# Patient Record
Sex: Female | Born: 1965 | Race: White | Hispanic: No | Marital: Married | Smoking: Current every day smoker
Health system: Southern US, Community
[De-identification: ages and names within clinical notes are randomized; demographics above are authoritative.]

## PROBLEM LIST (undated history)

## (undated) DIAGNOSIS — E119 Type 2 diabetes mellitus without complications: Secondary | ICD-10-CM

## (undated) DIAGNOSIS — K219 Gastro-esophageal reflux disease without esophagitis: Secondary | ICD-10-CM

## (undated) DIAGNOSIS — F419 Anxiety disorder, unspecified: Secondary | ICD-10-CM

## (undated) DIAGNOSIS — F32A Depression, unspecified: Secondary | ICD-10-CM

## (undated) DIAGNOSIS — F329 Major depressive disorder, single episode, unspecified: Secondary | ICD-10-CM

## (undated) HISTORY — PX: EYE SURGERY: SHX253

## (undated) HISTORY — PX: ABDOMINAL HYSTERECTOMY: SHX81

## (undated) HISTORY — PX: LAPAROSCOPIC ABDOMINAL EXPLORATION: SHX6249

## (undated) HISTORY — PX: CHOLECYSTECTOMY: SHX55

---

## 2010-09-28 ENCOUNTER — Encounter: Payer: Self-pay | Admitting: Emergency Medicine

## 2010-09-28 ENCOUNTER — Ambulatory Visit
Admission: RE | Admit: 2010-09-28 | Discharge: 2010-09-28 | Payer: Self-pay | Source: Home / Self Care | Admitting: Emergency Medicine

## 2010-09-28 DIAGNOSIS — K219 Gastro-esophageal reflux disease without esophagitis: Secondary | ICD-10-CM | POA: Insufficient documentation

## 2010-09-28 DIAGNOSIS — M654 Radial styloid tenosynovitis [de Quervain]: Secondary | ICD-10-CM | POA: Insufficient documentation

## 2010-09-28 DIAGNOSIS — J309 Allergic rhinitis, unspecified: Secondary | ICD-10-CM | POA: Insufficient documentation

## 2010-09-28 DIAGNOSIS — M25539 Pain in unspecified wrist: Secondary | ICD-10-CM | POA: Insufficient documentation

## 2010-10-04 ENCOUNTER — Telehealth (INDEPENDENT_AMBULATORY_CARE_PROVIDER_SITE_OTHER): Payer: Self-pay | Admitting: *Deleted

## 2010-10-14 NOTE — Progress Notes (Signed)
  Phone Note Outgoing Call Call back at [facility] Phone [phone]   Call placed by: Lajean Saver RN,  October 04, 2010 2:02 PM Call placed to: Patient Summary of Call: Callback: No answer, message left with reason for call and call back with any questions or concerns

## 2010-10-14 NOTE — Letter (Signed)
Summary: Internal Correspondence  Internal Correspondence   Imported By: Dannette Barbara 09/28/2010 14:19:30  _____________________________________________________________________  External Attachment:    Type:   Image     Comment:   External Document

## 2010-10-14 NOTE — Assessment & Plan Note (Signed)
Summary: POSSIBLE FRATURE OF RIGHT WRIST? NH   Vital Signs:  Patient Profile:   45 Years Old Female CC:      Rt wrist pain x 1 day hx of fracture Height:     64 inches Weight:      142 pounds O2 Sat:      98 % O2 treatment:    Room Air Temp:     97.8 degrees F oral Pulse rate:   78 / minute Pulse rhythm:   regular Resp:     16 per minute BP sitting:   109 / 69  (left arm) Cuff size:   regular  Vitals Entered By: Emilio Math (September 28, 2010 9:21 AM)                  Current Allergies: ! VICODIN ! CODEINE ! SEPTRA ! NAPROSYNHistory of Present Illness History from: patient Chief Complaint: Rt wrist pain x 1 day hx of fracture History of Present Illness: Patient with wrist pain since this morning.  Woke up with pain.  No known trauma or falling.  The only thing she can think of is lifting and carrying a chair yesterday and has been lifting her dogs. Pain is constant and sharp and radiates into hand and she thinks it "feels like a fracture" and would like to have it checked.  Last year she had discomfort in that hand/thumb and was worked up by neurology for carpal tunnel syndrome but all tests were normal.  She was thought to have Raynauds.  She did have a possible distal radius fracture about 10 years ago.  She has a soft brace that helps. She is planning on donating a kidney in 1-2 months.  Current Meds DEXILANT 30 MG CPDR (DEXLANSOPRAZOLE)  CELEXA 20 MG TABS (CITALOPRAM HYDROBROMIDE)  VIVELLE-DOT 0.0375 MG/24HR PTTW (ESTRADIOL)  FLONASE 50 MCG/ACT SUSP (FLUTICASONE PROPIONATE)  ZYRTEC ALLERGY 10 MG TABS (CETIRIZINE HCL)  PREDNISONE 10 MG TABS (PREDNISONE) 20mg  two times a day for 3 days, no taper  REVIEW OF SYSTEMS Constitutional Symptoms      Denies fever, chills, night sweats, weight loss, weight gain, and fatigue.  Eyes       Denies change in vision, eye pain, eye discharge, glasses, contact lenses, and eye surgery. Ear/Nose/Throat/Mouth       Denies hearing  loss/aids, change in hearing, ear pain, ear discharge, dizziness, frequent runny nose, frequent nose bleeds, sinus problems, sore throat, hoarseness, and tooth pain or bleeding.  Respiratory       Denies dry cough, productive cough, wheezing, shortness of breath, asthma, bronchitis, and emphysema/COPD.  Cardiovascular       Denies murmurs, chest pain, and tires easily with exhertion.    Gastrointestinal       Denies stomach pain, nausea/vomiting, diarrhea, constipation, blood in bowel movements, and indigestion. Genitourniary       Denies painful urination, kidney stones, and loss of urinary control. Neurological       Denies paralysis, seizures, and fainting/blackouts. Musculoskeletal       Complains of muscle pain, joint pain, joint stiffness, and decreased range of motion.      Denies redness, swelling, muscle weakness, and gout.  Skin       Denies bruising, unusual mles/lumps or sores, and hair/skin or nail changes.  Psych       Denies mood changes, temper/anger issues, anxiety/stress, speech problems, depression, and sleep problems.  Past History:  Past Medical History: Allergic rhinitis GERD  Past Surgical History: Cholecystectomy  Hysterectomy Eye surgery  Family History: Mother, D, Heart Disease Father, D, Heart Disease  Social History: 1/2 ppd, 23 yrs ETOH-no No [facility] Physical Exam General appearance: well developed, well nourished, no acute distress Head: normocephalic, atraumatic Neurological: distal NV status intact MSE: oriented to time, place, and person R hand/wrist: FROM, full strength all directions.  +TTP radial aspect APL/EPB, mild snuffbox tenderness but mostly on the tendons.  +Finklestein's normal.  Exam normal for ulnar, radial, and median nerve distributions, normal grip. No other bony tendneress Assessment New Problems: DE QUERVAIN'S TENOSYNOVITIS (ICD-727.04) GERD (ICD-530.81) ALLERGIC RHINITIS (ICD-477.9) WRIST PAIN, RIGHT  (ICD-719.43)   Plan New Medications/Changes: PREDNISONE 10 MG TABS (PREDNISONE) 20mg  two times a day for 3 days, no taper  #QS x 0, 09/28/2010, Hoyt Koch MD  New Orders: New Patient Level III [phone] T-DG Wrist Complete*R* [73110] Thumb Spica [L3070] Planning Comments:   DeQuervain's tenosynovitis from recent lifting.  Xray is normal.  Encouraged ice a few times a day (but caution with possible Raynaud's syndrome).  A thumb spica wrist splint was applied. Referral to Dr. Pearletha Forge if not better in a week to consider injection.  Patient prefers to wait on injection until then.  Instead Rx for 3 days Prednisone given and then may use Ibuprofen for a few more days after that and/or Tylenol.  She should contact the transplant center to let them know her current medications.   The patient and/or caregiver has been counseled thoroughly with regard to medications prescribed including dosage, schedule, interactions, rationale for use, and possible side effects and they verbalize understanding.  Diagnoses and expected course of recovery discussed and will return if not improved as expected or if the condition worsens. Patient and/or caregiver verbalized understanding.  Prescriptions: PREDNISONE 10 MG TABS (PREDNISONE) 20mg  two times a day for 3 days, no taper  #QS x 0   Entered and Authorized by:   Hoyt Koch MD   Signed by:   Hoyt Koch MD on 09/28/2010   Method used:   Print then Give to Patient   RxID:   [id number]   Orders Added: 1)  New Patient Level III [08657] 2)  T-DG Wrist Complete*R* [73110] 3)  Thumb Spica [L3070]

## 2011-09-21 ENCOUNTER — Emergency Department (INDEPENDENT_AMBULATORY_CARE_PROVIDER_SITE_OTHER)
Admission: EM | Admit: 2011-09-21 | Discharge: 2011-09-21 | Disposition: A | Discharge status: Home / Self Care | Payer: PRIVATE HEALTH INSURANCE | Source: Home / Self Care | Attending: Family Medicine | Admitting: Family Medicine

## 2011-09-21 ENCOUNTER — Encounter: Payer: Self-pay | Admitting: *Deleted

## 2011-09-21 DIAGNOSIS — J0100 Acute maxillary sinusitis, unspecified: Secondary | ICD-10-CM

## 2011-09-21 MED ORDER — AMOXICILLIN 875 MG PO TABS: 875.0000 mg | ORAL_TABLET | Freq: Two times a day (BID) | ORAL | Status: AC

## 2011-09-21 NOTE — ED Provider Notes (Signed)
History     CSN: [id number]  Arrival date & time 09/21/11  9147   First MD Initiated Contact with Patient 09/21/11 1216      Chief Complaint  Patient presents with  . Sinusitis      HPI Comments: Patient complains of onset of a mild cold about 3 weeks ago that gradually improved.  Over the past 6 days she has developed increased right facial pressure and nasal congestion.  Her right eye began draining yesterday (primarily in morning) and she has had low grade fever for about a week. She uses Nasonex spray occasionally.  Patient is a 46 y.o. female presenting with sinusitis. The history is provided by the patient.  Sinusitis  This is a new problem. The current episode started more than 1 week ago. The problem has been gradually worsening. Maximum temperature: low grade. The pain has been intermittent since onset. Associated symptoms include chills, congestion, sinus pressure, sore throat and cough. Pertinent negatives include no sweats, no ear pain, no hoarse voice, no swollen glands and no shortness of breath. Treatments tried: Zyrtec. The treatment provided mild relief.    Past Medical History  Diagnosis Date  . Endometriosis     Past Surgical History  Procedure Date  . Abdominal hysterectomy     Family History  Problem Relation Age of Onset  . Cancer Mother     breast    History  Substance Use Topics  . Smoking status: Current Everyday Smoker  . Smokeless tobacco: Not on file  . Alcohol Use: No    OB History    Grav Para Term Preterm Abortions TAB SAB Ect Mult Living                  Review of Systems  Constitutional: Positive for chills.  HENT: Positive for congestion, sore throat and sinus pressure. Negative for ear pain and hoarse voice.   Respiratory: Positive for cough. Negative for shortness of breath.    No sore throat presently. + cough No pleuritic pain No wheezing + nasal congestion + post-nasal drainage + sinus pain/pressure + itchy/red eye  on right No earache No hemoptysis No SOB + low grade fever/chills No nausea No vomiting No abdominal pain No diarrhea No urinary symptoms No skin rashes No fatigue No myalgias No headache Used OTC meds without relief  Allergies  Codeine; Hydrocodone-acetaminophen; Naproxen; and Sulfamethoxazole w/trimethoprim  Home Medications   Current Outpatient Rx  Name Route Sig Dispense Refill  . CITALOPRAM HYDROBROMIDE 20 MG PO TABS Oral Take 20 mg by mouth daily.    . DEXLANSOPRAZOLE 60 MG PO CPDR Oral Take 60 mg by mouth daily.    Marland Kitchen ESTRADIOL 0.075 MG/24HR TD PTTW Transdermal Place 1 patch onto the skin 2 (two) times a week.    Marland Kitchen NAPROXEN 125 MG/5ML PO SUSP Oral Take by mouth 2 (two) times daily.    . AMOXICILLIN 875 MG PO TABS Oral Take 1 tablet (875 mg total) by mouth 2 (two) times daily. 28 tablet 0    BP 99/65  Pulse 79  Temp(Src) 98.4 F (36.9 C) (Oral)  Resp 14  Ht 5\' 4"  (1.626 m)  Wt 135 lb (61.236 kg)  BMI 23.17 kg/m2  SpO2 99%  Physical Exam Nursing notes and Vital Signs reviewed. Appearance:  Patient appears healthy, stated age, and in no acute distress Eyes:  Pupils are equal, round, and reactive to light and accomodation.  Extraocular movement is intact.  Conjunctivae are not inflamed  Ears:  Canals normal.  Tympanic membranes normal.  Nose:  Moderately congested turbinates, worse on the right.  Right maxillary sinus tenderness is present.  Pharynx:  Normal Neck:  Supple.    Lungs:  Clear to auscultation.  Breath sounds are equal.  Heart:  Regular rate and rhythm without murmurs, rubs, or gallops.  Skin:  No rash present.   ED Course  Procedures  none      1. Acute maxillary sinusitis       MDM  Begin amoxicillin for two weeks. Take Mucinex D (guaifenesin with decongestant) twice daily for congestion.  Increase fluid intake, rest. May use Afrin nasal spray (or generic oxymetazoline) twice daily for about 5 days.  Also recommend using saline nasal  spray several times daily and/or saline nasal irrigation.  Use Nasonex spray each day after using Afrin and Saline irrigation.  Stop all antihistamines for now, and other non-prescription cough/cold preparations. Follow-up with family doctor if not improving 7 to 10 days        Donna Christen, MD 09/21/11 1237

## 2011-09-21 NOTE — ED Notes (Signed)
Patient c/o having a "cold since Christmas". In the past week she developed chills, facial pain, sinus drainage, cough and right eye drainage.

## 2011-09-21 NOTE — Discharge Instructions (Signed)
Take Mucinex D (guaifenesin with decongestant) twice daily for congestion.  Increase fluid intake, rest. May use Afrin nasal spray (or generic oxymetazoline) twice daily for about 5 days.  Also recommend using saline nasal spray several times daily and/or saline nasal irrigation.  Use Nasonex spray each day after using Afrin and Saline irrigation.  Stop all antihistamines for now, and other non-prescription cough/cold preparations. Follow-up with family doctor if not improving 7 to 10 days.

## 2011-09-23 ENCOUNTER — Telehealth: Payer: Self-pay | Admitting: Family Medicine

## 2012-03-28 ENCOUNTER — Emergency Department (INDEPENDENT_AMBULATORY_CARE_PROVIDER_SITE_OTHER): Payer: PRIVATE HEALTH INSURANCE

## 2012-03-28 ENCOUNTER — Emergency Department (INDEPENDENT_AMBULATORY_CARE_PROVIDER_SITE_OTHER)
Admission: EM | Admit: 2012-03-28 | Discharge: 2012-03-28 | Disposition: A | Discharge status: Home / Self Care | Payer: PRIVATE HEALTH INSURANCE | Source: Home / Self Care | Attending: Family Medicine | Admitting: Family Medicine

## 2012-03-28 ENCOUNTER — Encounter: Payer: Self-pay | Admitting: *Deleted

## 2012-03-28 DIAGNOSIS — M545 Low back pain, unspecified: Secondary | ICD-10-CM

## 2012-03-28 DIAGNOSIS — M533 Sacrococcygeal disorders, not elsewhere classified: Secondary | ICD-10-CM

## 2012-03-28 DIAGNOSIS — M549 Dorsalgia, unspecified: Secondary | ICD-10-CM

## 2012-03-28 MED ORDER — CYCLOBENZAPRINE HCL 10 MG PO TABS: 10.0000 mg | ORAL_TABLET | Freq: Three times a day (TID) | ORAL | Status: AC | PRN

## 2012-03-28 MED ORDER — PREDNISONE 20 MG PO TABS: 20.0000 mg | ORAL_TABLET | Freq: Two times a day (BID) | ORAL | Status: AC
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
## 2012-07-09 ENCOUNTER — Encounter: Payer: Self-pay | Admitting: *Deleted

## 2012-07-09 ENCOUNTER — Emergency Department (INDEPENDENT_AMBULATORY_CARE_PROVIDER_SITE_OTHER): Payer: PRIVATE HEALTH INSURANCE

## 2012-07-09 ENCOUNTER — Emergency Department (INDEPENDENT_AMBULATORY_CARE_PROVIDER_SITE_OTHER)
Admission: EM | Admit: 2012-07-09 | Discharge: 2012-07-09 | Disposition: A | Discharge status: Home / Self Care | Payer: PRIVATE HEALTH INSURANCE | Source: Home / Self Care

## 2012-07-09 DIAGNOSIS — J069 Acute upper respiratory infection, unspecified: Secondary | ICD-10-CM

## 2012-07-09 DIAGNOSIS — Z716 Tobacco abuse counseling: Secondary | ICD-10-CM

## 2012-07-09 DIAGNOSIS — J40 Bronchitis, not specified as acute or chronic: Secondary | ICD-10-CM

## 2012-07-09 MED ORDER — AZITHROMYCIN 250 MG PO TABS: ORAL_TABLET | ORAL | Status: DC

## 2012-07-09 MED ORDER — METHYLPREDNISOLONE SODIUM SUCC 125 MG IJ SOLR
125.0000 mg | Freq: Once | INTRAMUSCULAR | Status: AC
  Administered 2012-07-09: 125 mg via INTRAMUSCULAR

## 2012-07-09 MED ORDER — PREDNISONE 50 MG PO TABS: ORAL_TABLET | ORAL | Status: DC

## 2012-07-09 NOTE — ED Notes (Signed)
Patient c/o croupe cough not productive, sinus problems, and fatigue 2 weeks on and off.

## 2012-07-09 NOTE — ED Provider Notes (Signed)
History     CSN: [id number]  Arrival date & time 07/09/12  1535   First MD Initiated Contact with Patient 07/09/12 1541      Chief Complaint  Patient presents with  . Sore Throat  . Sinus Problem   HPI URI Symptoms Onset: 2 weeks  Description: sinus pressure, nasal congestion, cough, sore throat, nasal drainage  Modifying factors:  + smoker   Symptoms Nasal discharge: yes  Fever: no Sore throat: yes Cough: yes Wheezing: no Ear pain: no GI symptoms: no Sick contacts: yes; multiple   Red Flags  Stiff neck: no Dyspnea: mild Rash: no Swallowing difficulty: no  Sinusitis Risk Factors Headache/face pain: no Double sickening: no tooth pain: no  Allergy Risk Factors Sneezing: yes Itchy scratchy throat: no Seasonal symptoms: yes  Flu Risk Factors Headache: no muscle aches: no severe fatigue: no   Past Medical History  Diagnosis Date  . Endometriosis     Past Surgical History  Procedure Date  . Abdominal hysterectomy   . Eye surgery     Family History  Problem Relation Age of Onset  . Cancer Mother     breast  . Heart Problems Other     History  Substance Use Topics  . Smoking status: Current Every Day Smoker  . Smokeless tobacco: Not on file  . Alcohol Use: No    OB History    Grav Para Term Preterm Abortions TAB SAB Ect Mult Living                  Review of Systems  All other systems reviewed and are negative.    Allergies  Codeine; Hydrocodone-acetaminophen; Naproxen; and Sulfamethoxazole w-trimethoprim  Home Medications   Current Outpatient Rx  Name Route Sig Dispense Refill  . AZITHROMYCIN 250 MG PO TABS  Take 2 tabs PO x 1 dose, then 1 tab PO QD x 4 days 6 tablet 0  . CITALOPRAM HYDROBROMIDE 20 MG PO TABS Oral Take 20 mg by mouth daily.    . DEXLANSOPRAZOLE 60 MG PO CPDR Oral Take 60 mg by mouth daily.    Marland Kitchen ESTRADIOL 0.075 MG/24HR TD PTTW Transdermal Place 1 patch onto the skin 2 (two) times a week.    Marland Kitchen NAPROXEN 125  MG/5ML PO SUSP Oral Take by mouth 2 (two) times daily.    Marland Kitchen PREDNISONE 50 MG PO TABS  1 tab daily x 5 days 5 tablet 0    BP 93/62  Pulse 74  Temp 97.6 F (36.4 C) (Oral)  Resp 20  Ht 5\' 4"  (1.626 m)  Wt 140 lb 12 oz (63.844 kg)  BMI 24.16 kg/m2  SpO2 99%  Physical Exam  Constitutional: She is oriented to person, place, and time. She appears well-developed and well-nourished.  HENT:  Head: Normocephalic and atraumatic.  Right Ear: External ear normal.  Left Ear: External ear normal.       +nasal erythema, rhinorrhea bilaterally, + post oropharyngeal erythema    Eyes: Conjunctivae normal are normal. Pupils are equal, round, and reactive to light.  Neck: Normal range of motion. Neck supple.  Cardiovascular: Normal rate, regular rhythm and normal heart sounds.   Pulmonary/Chest: Effort normal and breath sounds normal.       Faint wheezes    Abdominal: Soft. Bowel sounds are normal.  Musculoskeletal: Normal range of motion.  Lymphadenopathy:    She has no cervical adenopathy.  Neurological: She is alert and oriented to person, place, and time.  Skin: Skin  is warm.    ED Course  Procedures (including critical care time)  Labs Reviewed - No data to display Dg Chest 2 View  07/09/2012  *RADIOLOGY REPORT*  Clinical Data: Cough  CHEST - 2 VIEW  Comparison: 03/28/2012  Findings: Heart size is normal.  Mediastinal shadows are normal. There is bronchial thickening consistent with bronchitis but no consolidation, collapse or effusion.  No significant bony finding.  IMPRESSION: Bronchitis.  No consolidation or collapse.   Original Report Authenticated By: Thomasenia Sales, M.D.      1. URI (upper respiratory infection)   2. Bronchitis   3. Tobacco abuse counseling       MDM  Bronchitis noted on CXR  Likely viral induced with underlying smoking history.  Soulmedrol 125mg  IM x1 Prednisone x 5 days.  Zpak for atypical coverage.  Discussed smoking cessation.  Infectious and  resp red flags reviewed.  Follow up as needed.     The patient and/or caregiver has been counseled thoroughly with regard to treatment plan and/or medications prescribed including dosage, schedule, interactions, rationale for use, and possible side effects and they verbalize understanding. Diagnoses and expected course of recovery discussed and will return if not improved as expected or if the condition worsens. Patient and/or caregiver verbalized understanding.              Doree Albee, MD 07/09/12 1710

## 2012-07-11 ENCOUNTER — Telehealth: Payer: Self-pay | Admitting: *Deleted

## 2012-07-11 NOTE — ED Notes (Signed)
Ashley Lynn called reporting facial redness and swelling of eyelids that started Monday night (07/09/12). She had started the zpak and received an injection of Solumedrol before the reaction but had not started the prednisone. She has taken zpak in the past without reaction and is unsure about solumedrol. She denies any respiratory or cardiac distress. After talking with Dr. Cathren Harsh, I advised her to continue the prednisone, stop the zpak and take an antihistamine. Come in for a follow up if no improvement in 24 hours. Charrise asked about another antibiotic, I told her one was not needed since per Dr. Elwood Blas note she is likely viral and zpak was given for atypical coverage. If she worsens then antibiotic will be revisited. She verbalized understanding will take Zyrtec.

## 2013-10-09 ENCOUNTER — Emergency Department (INDEPENDENT_AMBULATORY_CARE_PROVIDER_SITE_OTHER)
Admission: EM | Admit: 2013-10-09 | Discharge: 2013-10-09 | Disposition: A | Discharge status: Home / Self Care | Payer: PRIVATE HEALTH INSURANCE | Source: Home / Self Care | Attending: Family Medicine | Admitting: Family Medicine

## 2013-10-09 ENCOUNTER — Emergency Department (INDEPENDENT_AMBULATORY_CARE_PROVIDER_SITE_OTHER): Payer: PRIVATE HEALTH INSURANCE

## 2013-10-09 ENCOUNTER — Encounter: Payer: Self-pay | Admitting: Emergency Medicine

## 2013-10-09 DIAGNOSIS — R059 Cough, unspecified: Secondary | ICD-10-CM

## 2013-10-09 DIAGNOSIS — R52 Pain, unspecified: Secondary | ICD-10-CM

## 2013-10-09 DIAGNOSIS — R05 Cough: Secondary | ICD-10-CM

## 2013-10-09 DIAGNOSIS — R509 Fever, unspecified: Secondary | ICD-10-CM

## 2013-10-09 DIAGNOSIS — J209 Acute bronchitis, unspecified: Secondary | ICD-10-CM

## 2013-10-09 MED ORDER — BENZONATATE 200 MG PO CAPS: 200.0000 mg | ORAL_CAPSULE | Freq: Every day | ORAL | Status: DC

## 2013-10-09 MED ORDER — CEFDINIR 300 MG PO CAPS: 300.0000 mg | ORAL_CAPSULE | Freq: Two times a day (BID) | ORAL | Status: DC

## 2013-10-09 NOTE — ED Notes (Addendum)
Ashley Lynn c/o "cold symptoms" x 1 wkek @ days ago she developed HA, body aches, fever and cough, productive at times. No flu vac this season.

## 2013-10-09 NOTE — ED Provider Notes (Signed)
CSN: [id number]     Arrival date & time 10/09/13  0907 History   First MD Initiated Contact with Patient 10/09/13 [phone]     Chief Complaint  Patient presents with  . Generalized Body Aches  . Cough     HPI Comments: Patient developed mild cold-like illness about two weeks ago, but did not feel ill at that time.  Her sinus congestion and cough have persisted.  Four days ago she developed increased fatigue, chills, low grade fever, headache, and myalgias.  Her cough has worsened.  She has tightness in her anterior chest.  The history is provided by the patient.    Past Medical History  Diagnosis Date  . Endometriosis    Past Surgical History  Procedure Laterality Date  . Abdominal hysterectomy    . Eye surgery     Family History  Problem Relation Age of Onset  . Cancer Mother     breast  . Heart Problems Other   . Heart attack Father    History  Substance Use Topics  . Smoking status: Current Every Day Smoker  . Smokeless tobacco: Never Used  . Alcohol Use: No   OB History   Grav Para Term Preterm Abortions TAB SAB Ect Mult Living                 Review of Systems + sore throat + cough No pleuritic pain, but has tightness in anterior chest No wheezing + nasal congestion + post-nasal drainage No sinus pain/pressure No itchy/red eyes No earache No hemoptysis + SOB + low grade fever, + chills + nausea + vomiting, resolved No abdominal pain No diarrhea No urinary symptoms No skin rash + fatigue + myalgias + headache Used OTC meds without relief  Allergies  Codeine; Hydrocodone-acetaminophen; Naproxen; and Sulfamethoxazole-trimethoprim  Home Medications   Current Outpatient Rx  Name  Route  Sig  Dispense  Refill  . benzonatate (TESSALON) 200 MG capsule   Oral   Take 1 capsule (200 mg total) by mouth at bedtime. Take as needed for cough   12 capsule   0   . cefdinir (OMNICEF) 300 MG capsule   Oral   Take 1 capsule (300 mg total) by mouth 2 (two)  times daily.   20 capsule   0   . citalopram (CELEXA) 20 MG tablet   Oral   Take 20 mg by mouth daily.         Marland Kitchen dexlansoprazole (DEXILANT) 60 MG capsule   Oral   Take 60 mg by mouth daily.         Marland Kitchen estradiol (VIVELLE-DOT) 0.075 MG/24HR   Transdermal   Place 1 patch onto the skin 2 (two) times a week.          BP 102/65  Pulse 84  Temp(Src) 98.2 F (36.8 C) (Oral)  Resp 16  Wt 142 lb (64.411 kg)  SpO2 99% Physical Exam Nursing notes and Vital Signs reviewed. Appearance:  Patient appears healthy, stated age, and in no acute distress Eyes:  Pupils are equal, round, and reactive to light and accomodation.  Extraocular movement is intact.  Conjunctivae are not inflamed  Ears:  Canals normal.  Tympanic membranes normal.  Nose:  Mildly congested turbinates.  No sinus tenderness.   Pharynx:  Normal Neck:  Supple.  Tender shotty posterior nodes are palpated bilaterally  Lungs:  Clear to auscultation.  Breath sounds are equal.  Heart:  Regular rate and rhythm without murmurs, rubs, or  gallops.  Abdomen:  Nontender without masses or hepatosplenomegaly.  Bowel sounds are present.  No CVA or flank tenderness.  Extremities:  No edema.  No calf tenderness Skin:  No rash present.   ED Course  Procedures  none    Imaging Review Dg Chest 2 View  10/09/2013   CLINICAL DATA:  Cough.  Fever.  Body aches.  EXAM: CHEST  2 VIEW  COMPARISON:  07/09/2012  FINDINGS: Heart size is normal. Mediastinal shadows are normal. There may be mild central bronchial thickening but there is no infiltrate, collapse or effusion. No significant bony finding.  IMPRESSION: Possible mild bronchitis.  No consolidation or collapse.   Electronically Signed   By: Paulina FusiMark  Shogry M.D.   On: 10/09/2013 10:28      MDM   1. Acute bronchitis; present illness may represent an initial viral URI with a developing bacterial bronchitis, or onset of influenza about 3 to 4 days ago.    Will begin Omnicef for bacterial  coverage.  Prescription written for Benzonatate [facility](Tessalon) to take at bedtime for night-time cough.  Take plain Mucinex (1200 mg guaifenesin) twice daily for cough and congestion.  May add Sudafed for sinus congestion.   Increase fluid intake, rest. May use Afrin nasal spray (or generic oxymetazoline) twice daily for about 5 days.  Also recommend using saline nasal spray several times daily and saline nasal irrigation (AYR is a common brand) Try warm salt water gargles for sore throat.  Stop all antihistamines for now, and other non-prescription cough/cold preparations. May take Tylenol for fever, body aches, etc. Follow-up with family doctor if not improving about one week.     Lattie HawStephen A Beese, MD 10/09/13 1048

## 2013-10-09 NOTE — Discharge Instructions (Signed)
Take plain Mucinex (1200 mg guaifenesin) twice daily for cough and congestion.  May add Sudafed for sinus congestion.   Increase fluid intake, rest. May use Afrin nasal spray (or generic oxymetazoline) twice daily for about 5 days.  Also recommend using saline nasal spray several times daily and saline nasal irrigation (AYR is a common brand) Try warm salt water gargles for sore throat.  Stop all antihistamines for now, and other non-prescription cough/cold preparations. May take Tylenol for fever, body aches, etc. Follow-up with family doctor if not improving about one week.

## 2014-06-02 ENCOUNTER — Emergency Department (INDEPENDENT_AMBULATORY_CARE_PROVIDER_SITE_OTHER)
Admission: EM | Admit: 2014-06-02 | Discharge: 2014-06-02 | Disposition: A | Discharge status: Home / Self Care | Payer: PRIVATE HEALTH INSURANCE | Source: Home / Self Care | Attending: Emergency Medicine | Admitting: Emergency Medicine

## 2014-06-02 ENCOUNTER — Encounter: Payer: Self-pay | Admitting: Emergency Medicine

## 2014-06-02 DIAGNOSIS — J0100 Acute maxillary sinusitis, unspecified: Secondary | ICD-10-CM

## 2014-06-02 DIAGNOSIS — J0101 Acute recurrent maxillary sinusitis: Secondary | ICD-10-CM

## 2014-06-02 HISTORY — DX: Major depressive disorder, single episode, unspecified: F32.9

## 2014-06-02 HISTORY — DX: Depression, unspecified: F32.A

## 2014-06-02 HISTORY — DX: Gastro-esophageal reflux disease without esophagitis: K21.9

## 2014-06-02 HISTORY — DX: Anxiety disorder, unspecified: F41.9

## 2014-06-02 MED ORDER — FLUTICASONE PROPIONATE 50 MCG/ACT NA SUSP: NASAL | Status: DC

## 2014-06-02 MED ORDER — BENZONATATE 200 MG PO CAPS: ORAL_CAPSULE | ORAL | Status: DC

## 2014-06-02 MED ORDER — AZITHROMYCIN 250 MG PO TABS: ORAL_TABLET | ORAL | Status: DC

## 2014-06-02 NOTE — ED Notes (Signed)
Reports 2 day history of facial/sinus pain with edema under right eye; cough; right ear pain; headache; hoarseness. Has recently had pancreatitis.

## 2014-06-02 NOTE — ED Provider Notes (Signed)
CSN: [id number]     Arrival date & time 06/02/14  1359 History   First MD Initiated Contact with Patient 06/02/14 1408     Chief Complaint  Patient presents with  . Facial Pain  . Otalgia  . Headache  . Cough  . Hoarse   (Consider location/radiation/quality/duration/timing/severity/associated sxs/prior Treatment) HPI SINUSITIS  Onset: 3 days Facial/sinus pressure, R>L with discolored nasal mucus.    Severity: moderate Tried OTC meds without significant relief.  Symptoms:  + Fever  + URI prodrome with nasal congestion + Minimal swollen neck glands + mild Sinus Headache + mild ear pressure, with right ear pain. No drainage  No Allergy symptoms No significant Sore Throat No eye symptoms     No significant Cough No chest pain No shortness of breath  No wheezing  No Abdominal Pain No Nausea No Vomiting No diarrhea  No Myalgias No focal neurologic symptoms No syncope No Rash  No Urinary symptoms   Past Medical History  Diagnosis Date  . Endometriosis   . Anxiety   . GERD (gastroesophageal reflux disease)   . Depression    Past Surgical History  Procedure Laterality Date  . Abdominal hysterectomy    . Eye surgery     Family History  Problem Relation Age of Onset  . Cancer Mother     breast  . Heart Problems Other   . Heart attack Father    History  Substance Use Topics  . Smoking status: Current Every Day Smoker  . Smokeless tobacco: Never Used  . Alcohol Use: No   OB History   Grav Para Term Preterm Abortions TAB SAB Ect Mult Living                 Review of Systems  All other systems reviewed and are negative.   Allergies  Codeine; Hydrocodone-acetaminophen; Naproxen; and Sulfamethoxazole-trimethoprim  Home Medications   Prior to Admission medications   Medication Sig Start Date End Date Taking? Authorizing Provider  azithromycin (ZITHROMAX Z-PAK) 250 MG tablet Take 2 tablets on day one, then 1 tablet daily on days 2 through 5  06/02/14   Lajean Manes, MD  benzonatate (TESSALON) 200 MG capsule Take 1 every 8 hours as needed for cough. 06/02/14   Lajean Manes, MD  citalopram (CELEXA) 20 MG tablet Take 20 mg by mouth daily.    Historical Provider, MD  dexlansoprazole (DEXILANT) 60 MG capsule Take 60 mg by mouth daily.    Historical Provider, MD  estradiol (VIVELLE-DOT) 0.075 MG/24HR Place 1 patch onto the skin 2 (two) times a week.    Historical Provider, MD  fluticasone Aleda Grana) 50 MCG/ACT nasal spray 1 or 2 sprays each nostril twice a day 06/02/14   Lajean Manes, MD   BP 89/58  Pulse 100  Temp(Src) 98 F (36.7 C) (Oral)  Resp 16  Ht  (1.626 m)  Wt 140 lb (63.504 kg)  BMI 24.02 kg/m2  SpO2 99% Physical Exam  Nursing note and vitals reviewed. Constitutional: She is oriented to person, place, and time. She appears well-developed and well-nourished. No distress.  HENT:  Head: Normocephalic and atraumatic.  Right Ear: External ear and ear canal normal. No drainage. Tympanic membrane is erythematous. Tympanic membrane is not bulging. A middle ear effusion is present.  Left Ear: Tympanic membrane, external ear and ear canal normal.  Nose: Mucosal edema and rhinorrhea present. Right sinus exhibits maxillary sinus tenderness. Left sinus exhibits maxillary sinus tenderness.  Mouth/Throat: Oropharynx is clear and  moist. No oral lesions. No oropharyngeal exudate.  Eyes: Conjunctivae and EOM are normal. Pupils are equal, round, and reactive to light. Right eye exhibits no discharge. Left eye exhibits no discharge. No scleral icterus.  Neck: Neck supple.  Cardiovascular: Normal rate, regular rhythm and normal heart sounds.   Pulmonary/Chest: Effort normal and breath sounds normal. She has no wheezes. She has no rales.  Lymphadenopathy:    She has no cervical adenopathy.  Neurological: She is alert and oriented to person, place, and time.  Skin: Skin is warm and dry.    ED Course  Procedures (including critical care  time) Labs Review Labs Reviewed - No data to display  Imaging Review No results found.   MDM   1. Acute recurrent maxillary sinusitis    and right otitis media  Treatment options discussed, as well as risks, benefits, alternatives. Reviewed her drug allergy history. She is allergic to penicillins and Septra. She has taken azithromycin in the past without any problems or side effects. Patient voiced understanding and agreement with the following plans: Z-Pak Flonase Other symptomatic care discussed Tessalon Perles when necessary cough  Follow-up with your primary care doctor in 5-7 days if not improving, or sooner if symptoms become worse. Precautions discussed. Red flags discussed. Questions invited and answered. Patient voiced understanding and agreement.      Lajean Manes, MD 06/02/14 [phone]

## 2014-10-24 ENCOUNTER — Emergency Department (INDEPENDENT_AMBULATORY_CARE_PROVIDER_SITE_OTHER)
Admission: EM | Admit: 2014-10-24 | Discharge: 2014-10-24 | Disposition: A | Discharge status: Home / Self Care | Payer: PRIVATE HEALTH INSURANCE | Source: Home / Self Care | Attending: Emergency Medicine | Admitting: Emergency Medicine

## 2014-10-24 ENCOUNTER — Encounter: Payer: Self-pay | Admitting: *Deleted

## 2014-10-24 DIAGNOSIS — J209 Acute bronchitis, unspecified: Secondary | ICD-10-CM

## 2014-10-24 DIAGNOSIS — R69 Illness, unspecified: Secondary | ICD-10-CM

## 2014-10-24 DIAGNOSIS — J111 Influenza due to unidentified influenza virus with other respiratory manifestations: Secondary | ICD-10-CM

## 2014-10-24 MED ORDER — AZITHROMYCIN 250 MG PO TABS: ORAL_TABLET | ORAL | Status: DC

## 2014-10-24 MED ORDER — BENZONATATE 200 MG PO CAPS: ORAL_CAPSULE | ORAL | Status: DC

## 2014-10-24 MED ORDER — OSELTAMIVIR PHOSPHATE 75 MG PO CAPS: ORAL_CAPSULE | ORAL | Status: DC

## 2014-10-24 NOTE — ED Notes (Signed)
Pt c/o cough, HA, body aches, sore throat and cough x yesterday. C/o chills but afebrile.

## 2014-10-24 NOTE — ED Provider Notes (Signed)
CSN: [id number]     Arrival date & time 10/24/14  1003 History   First MD Initiated Contact with Patient 10/24/14 1015     Chief Complaint  Patient presents with  . Cough  . Sore Throat    HPI FLU/uri  HPI : Flu symptoms for about 1 day. Fever to 102 with chills, sweats, myalgias, fatigue, headache. Symptoms are progressively worsening, despite trying OTC fever reducing medicine and rest and fluids. Has decreased appetite, but tolerating some liquids by mouth. No history of recent tick bite. +exposure to someone with influenza  Review of Systems: Positive for fatigue, mild nasal congestion, mild sore throat, mild swollen anterior neck glands, cough productive of discolored yellow sputum. Negative for acute vision changes, stiff neck, focal weakness, syncope, seizures, respiratory distress, vomiting, diarrhea, GU symptoms, new Rash.  Past Medical History  Diagnosis Date  . Endometriosis   . Anxiety   . GERD (gastroesophageal reflux disease)   . Depression    Past Surgical History  Procedure Laterality Date  . Abdominal hysterectomy    . Eye surgery     Family History  Problem Relation Age of Onset  . Cancer Mother     breast  . Heart Problems Other   . Heart attack Father    History  Substance Use Topics  . Smoking status: Current Every Day Smoker  . Smokeless tobacco: Never Used  . Alcohol Use: No   OB History    No data available     Review of Systems  All other systems reviewed and are negative.   Allergies  Codeine; Hydrocodone-acetaminophen; Naproxen; and Sulfamethoxazole-trimethoprim  Home Medications   Prior to Admission medications   Medication Sig Start Date End Date Taking? Authorizing Provider  aspirin 81 MG tablet Take 81 mg by mouth daily.   Yes Historical Provider, MD  citalopram (CELEXA) 20 MG tablet Take 20 mg by mouth daily.   Yes Historical Provider, MD  dexlansoprazole (DEXILANT) 60 MG capsule Take 60 mg by mouth daily.   Yes Historical  Provider, MD  estradiol (VIVELLE-DOT) 0.075 MG/24HR Place 1 patch onto the skin 2 (two) times a week.   Yes Historical Provider, MD  azithromycin (ZITHROMAX Z-PAK) 250 MG tablet Take 2 tablets on day one, then 1 tablet daily on days 2 through 5 10/24/14   Lajean Manes, MD  benzonatate (TESSALON) 200 MG capsule Take 1 every 8 hours as needed for cough. 10/24/14   Lajean Manes, MD  oseltamivir (TAMIFLU) 75 MG capsule Starting today, take 1 capsule by mouth twice a day for 5 days. 10/24/14   Lajean Manes, MD   BP 96/62 mmHg  Pulse 84  Temp(Src) 97.4 F (36.3 C) (Oral)  Resp 16  Wt 142 lb (64.411 kg)  SpO2 97% Physical Exam  Constitutional: She appears well-developed and well-nourished.  Non-toxic appearance. She appears ill (very fatigued, but no cardiorespiratory distress). No distress.  HENT:  Head: Normocephalic and atraumatic.  Right Ear: Tympanic membrane and external ear normal.  Left Ear: Tympanic membrane and external ear normal.  Nose: Rhinorrhea present.  Mouth/Throat: Mucous membranes are normal. Posterior oropharyngeal erythema (mild redness ) present. No oropharyngeal exudate.  Eyes: Conjunctivae are normal. Right eye exhibits no discharge. Left eye exhibits no discharge. No scleral icterus.  Neck: Neck supple.  Cardiovascular: Normal rate, regular rhythm and normal heart sounds.   Pulmonary/Chest: No stridor. No respiratory distress. She has no wheezes. She has no rales.  A few anterior rhonchi . No rales.  Breath sounds equal bilaterally. Her voice is hoarse.  Abdominal: Soft. There is no tenderness.  Musculoskeletal: She exhibits no edema.  Lymphadenopathy:    She has cervical adenopathy (mild shoddy anterior cervical nodes).  Neurological: She is alert.  Skin: Skin is warm and intact. No rash noted. She is diaphoretic.  Psychiatric: She has a normal mood and affect.  Nursing note and vitals reviewed.   ED Course  Procedures (including critical care time) Labs  Review Labs Reviewed - No data to display  Imaging Review No results found.   MDM   1. Acute bronchitis, unspecified organism   2. Influenza-like illness   workup options discussed. She declined any testing at this time. I agree with clinical, empiric treatment to cover bronchitis pathogens and influenza. Treatment options discussed, as well as risks, benefits, alternatives. Patient voiced understanding and agreement with the following plans:   New Prescriptions   AZITHROMYCIN (ZITHROMAX Z-PAK) 250 MG TABLET    Take 2 tablets on day one, then 1 tablet daily on days 2 through 5   BENZONATATE (TESSALON) 200 MG CAPSULE    Take 1 every 8 hours as needed for cough.   OSELTAMIVIR (TAMIFLU) 75 MG CAPSULE    Starting today, take 1 capsule by mouth twice a day for 5 days.   OTC Flonase prn. Other symptomatic care Follow-up with your primary care doctor in 5-7 days if not improving, or sooner if symptoms become worse. Precautions discussed. Red flags discussed.--ER If any red flag Questions invited and answered. Patient voiced understanding and agreement.   Lajean Manesavid Massey, MD 10/24/14 [phone]

## 2014-11-07 ENCOUNTER — Emergency Department (INDEPENDENT_AMBULATORY_CARE_PROVIDER_SITE_OTHER): Payer: PRIVATE HEALTH INSURANCE

## 2014-11-07 ENCOUNTER — Emergency Department (INDEPENDENT_AMBULATORY_CARE_PROVIDER_SITE_OTHER)
Admission: EM | Admit: 2014-11-07 | Discharge: 2014-11-07 | Disposition: A | Discharge status: Home / Self Care | Payer: PRIVATE HEALTH INSURANCE | Source: Home / Self Care | Attending: Emergency Medicine | Admitting: Emergency Medicine

## 2014-11-07 ENCOUNTER — Encounter: Payer: Self-pay | Admitting: *Deleted

## 2014-11-07 DIAGNOSIS — R05 Cough: Secondary | ICD-10-CM

## 2014-11-07 DIAGNOSIS — R059 Cough, unspecified: Secondary | ICD-10-CM

## 2014-11-07 DIAGNOSIS — R0981 Nasal congestion: Secondary | ICD-10-CM

## 2014-11-07 DIAGNOSIS — J209 Acute bronchitis, unspecified: Secondary | ICD-10-CM

## 2014-11-07 DIAGNOSIS — R062 Wheezing: Secondary | ICD-10-CM

## 2014-11-07 DIAGNOSIS — R6883 Chills (without fever): Secondary | ICD-10-CM

## 2014-11-07 MED ORDER — AZITHROMYCIN 250 MG PO TABS: ORAL_TABLET | ORAL | Status: DC

## 2014-11-07 MED ORDER — PREDNISONE (PAK) 10 MG PO TABS: ORAL_TABLET | ORAL | Status: DC

## 2014-11-07 MED ORDER — BENZONATATE 200 MG PO CAPS: ORAL_CAPSULE | ORAL | Status: DC

## 2014-11-07 NOTE — ED Provider Notes (Signed)
CSN: [id number]     Arrival date & time 11/07/14  [id number] History   First MD Initiated Contact with Patient 11/07/14 [phone]     Chief Complaint  Patient presents with  . Wheezing   (Consider location/radiation/quality/duration/timing/severity/associated sxs/prior Treatment) HPI Pt reports that she initially improved from her visit on 10/24/2014, but still has wheezing, rattling sounds in her lungs with chills and nasal congestion. the chest congestion and rattling sounds are worsening and she is concerned about ruling out pneumonia. She might have had a low-grade fever, not currently. Past Medical History  Diagnosis Date  . Endometriosis   . Anxiety   . GERD (gastroesophageal reflux disease)   . Depression    Past Surgical History  Procedure Laterality Date  . Abdominal hysterectomy    . Eye surgery     Family History  Problem Relation Age of Onset  . Cancer Mother     breast  . Heart Problems Other   . Heart attack Father    History  Substance Use Topics  . Smoking status: Current Every Day Smoker -- 0.50 packs/day    Types: Cigarettes  . Smokeless tobacco: Never Used  . Alcohol Use: No   OB History    No data available     Review of Systems  All other systems reviewed and are negative.   Allergies  Codeine; Eggs or egg-derived products; Hydrocodone-acetaminophen; Naproxen; Penicillins; and Sulfamethoxazole-trimethoprim  Home Medications   Prior to Admission medications   Medication Sig Start Date End Date Taking? Authorizing Provider  aspirin 81 MG tablet Take 81 mg by mouth daily.    Historical Provider, MD  azithromycin (ZITHROMAX Z-PAK) 250 MG tablet Take 2 tablets on day one, then 1 tablet daily on days 2 through 5 11/07/14   Lajean Manesavid Massey, MD  benzonatate (TESSALON) 200 MG capsule Take 1 every 8 hours as needed for cough. 11/07/14   Lajean Manesavid Massey, MD  citalopram (CELEXA) 20 MG tablet Take 20 mg by mouth daily.    Historical Provider, MD  dexlansoprazole  (DEXILANT) 60 MG capsule Take 60 mg by mouth daily.    Historical Provider, MD  estradiol (VIVELLE-DOT) 0.075 MG/24HR Place 1 patch onto the skin 2 (two) times a week.    Historical Provider, MD  predniSONE (STERAPRED UNI-PAK) 10 MG tablet Take as directed for 6 days.--Take 6 on day 1, 5 on day 2, 4 on day 3, then 3 tablets on day 4, then 2 tablets on day 5, then 1 on day 6. 11/07/14   Lajean Manesavid Massey, MD   BP 102/68 mmHg  Pulse 77  Temp(Src) 97.8 F (36.6 C) (Oral)  Resp 18  Ht 5\' 4"  (1.626 m)  Wt 143 lb (64.864 kg)  BMI 24.53 kg/m2  SpO2 97% Physical Exam  Constitutional: She is oriented to person, place, and time. She appears well-developed and well-nourished. No distress.  HENT:  Head: Normocephalic and atraumatic.  Right Ear: Tympanic membrane normal.  Left Ear: Tympanic membrane normal.  Nose: Nose normal.  Mouth/Throat: Oropharynx is clear and moist. No oropharyngeal exudate.  Eyes: Right eye exhibits no discharge. Left eye exhibits no discharge. No scleral icterus.  Neck: Neck supple.  Cardiovascular: Normal rate, regular rhythm and normal heart sounds.   Pulmonary/Chest: No respiratory distress. She has wheezes (Minimal late expiratory wheezes throughout, but air expansion normal and equal bilaterally otherwise). She has rhonchi (Mild, diffuse, which clear after coughing.). She has no rales.  Lymphadenopathy:    She has no cervical adenopathy.  Neurological: She is alert and oriented to person, place, and time.  Skin: Skin is warm and dry.  Nursing note and vitals reviewed.   ED Course  Procedures (including critical care time) Labs Review Labs Reviewed - No data to display  Imaging Review Dg Chest 2 View  11/07/2014   CLINICAL DATA:  Cough, nasal congestion, and chills for 2 weeks.  EXAM: CHEST  2 VIEW  COMPARISON:  10/09/2013.  FINDINGS: The heart size and mediastinal contours are within normal limits. Both lungs are clear. The visualized skeletal structures are  unremarkable.  IMPRESSION: No active cardiopulmonary disease.  Stable exam.   Electronically Signed   By: Davonna Belling M.D.   On: 11/07/2014 10:28     MDM  Chest x-ray shows no active disease. No pneumonia  1. Acute bronchitis with bronchospasm   2. Cough   3. Wheezing    Treatment options discussed, as well as risks, benefits, alternatives. Patient voiced understanding and agreement with the following plans: Discharge Medication List as of 11/07/2014 10:56 AM    START taking these medications   Details  predniSONE (STERAPRED UNI-PAK) 10 MG tablet Take as directed for 6 days.--Take 6 on day 1, 5 on day 2, 4 on day 3, then 3 tablets on day 4, then 2 tablets on day 5, then 1 on day 6., Normal      Other symptomatic care discussed. Advised to quit smoking Follow-up with your primary care doctor in 5-7 days if not improving, or sooner if symptoms become worse. Precautions discussed. Red flags discussed. Questions invited and answered. Patient voiced understanding and agreement.       Lajean Manes, MD 11/07/14 [phone]

## 2014-11-07 NOTE — ED Notes (Signed)
Pt reports that she feels better from her visit on 10/24/2014, but still has wheezing, rattling sounds in her lungs with chills and nasal congestion.

## 2015-01-19 ENCOUNTER — Emergency Department (INDEPENDENT_AMBULATORY_CARE_PROVIDER_SITE_OTHER): Payer: PRIVATE HEALTH INSURANCE

## 2015-01-19 ENCOUNTER — Encounter: Payer: Self-pay | Admitting: *Deleted

## 2015-01-19 ENCOUNTER — Emergency Department (INDEPENDENT_AMBULATORY_CARE_PROVIDER_SITE_OTHER)
Admission: EM | Admit: 2015-01-19 | Discharge: 2015-01-19 | Disposition: A | Discharge status: Home / Self Care | Payer: PRIVATE HEALTH INSURANCE | Source: Home / Self Care | Attending: Family Medicine | Admitting: Family Medicine

## 2015-01-19 DIAGNOSIS — M4186 Other forms of scoliosis, lumbar region: Secondary | ICD-10-CM | POA: Diagnosis not present

## 2015-01-19 DIAGNOSIS — M5416 Radiculopathy, lumbar region: Secondary | ICD-10-CM

## 2015-01-19 DIAGNOSIS — M5417 Radiculopathy, lumbosacral region: Secondary | ICD-10-CM

## 2015-01-19 MED ORDER — METAXALONE 800 MG PO TABS: 800.0000 mg | ORAL_TABLET | Freq: Three times a day (TID) | ORAL | Status: DC

## 2015-01-19 MED ORDER — PREDNISONE 20 MG PO TABS: 20.0000 mg | ORAL_TABLET | Freq: Two times a day (BID) | ORAL | Status: DC

## 2015-01-19 NOTE — ED Provider Notes (Signed)
CSN: [id number]     Arrival date & time 01/19/15  1342 History   First MD Initiated Contact with Patient 01/19/15 1454     Chief Complaint  Patient presents with  . Back Pain      HPI Comments: About 12 days ago patient developed lower back ache.  One week ago the pain began to radiate intermittently to her right anterior/lateral thigh which she describes as a sensation of coldness.  Her pain is better when she is supine on her right side, and leaning forward.  The pain is worse when she coughs.   Patient is a 49 y.o. female presenting with back pain. The history is provided by the patient.  Back Pain Location:  Lumbar spine Quality:  Aching Radiates to:  R thigh Pain severity:  Moderate Onset quality:  Sudden Duration:  12 days Timing:  Constant Progression:  Worsening Chronicity:  New Context: not recent injury   Relieved by: leaning forward. Worsened by:  Bowel movement and coughing Ineffective treatments:  Heating pad Associated symptoms: paresthesias   Associated symptoms: no abdominal pain, no bladder incontinence, no bowel incontinence, no dysuria, no fever, no headaches, no leg pain, no numbness, no pelvic pain, no perianal numbness, no tingling and no weakness     Past Medical History  Diagnosis Date  . Endometriosis   . Anxiety   . GERD (gastroesophageal reflux disease)   . Depression    Past Surgical History  Procedure Laterality Date  . Abdominal hysterectomy    . Eye surgery     Family History  Problem Relation Age of Onset  . Cancer Mother     breast  . Heart Problems Other   . Heart attack Father    History  Substance Use Topics  . Smoking status: Current Every Day Smoker -- 0.50 packs/day    Types: Cigarettes  . Smokeless tobacco: Never Used  . Alcohol Use: No   OB History    No data available     Review of Systems  Constitutional: Negative for fever.  Gastrointestinal: Negative for abdominal pain and bowel incontinence.  Genitourinary:  Negative for bladder incontinence, dysuria and pelvic pain.  Musculoskeletal: Positive for back pain.  Neurological: Positive for paresthesias. Negative for tingling, weakness, numbness and headaches.  All other systems reviewed and are negative.   Allergies  Codeine; Eggs or egg-derived products; Hydrocodone-acetaminophen; Naproxen; Penicillins; and Sulfamethoxazole-trimethoprim  Home Medications   Prior to Admission medications   Medication Sig Start Date End Date Taking? Authorizing Provider  cetirizine (ZYRTEC) 10 MG tablet Take 10 mg by mouth daily.   Yes Historical Provider, MD  citalopram (CELEXA) 20 MG tablet Take 20 mg by mouth daily.   Yes Historical Provider, MD  estradiol (VIVELLE-DOT) 0.075 MG/24HR Place 1 patch onto the skin 2 (two) times a week.   Yes Historical Provider, MD  famotidine (PEPCID) 40 MG tablet Take 40 mg by mouth daily.   Yes Historical Provider, MD  aspirin 81 MG tablet Take 81 mg by mouth daily.    Historical Provider, MD  dexlansoprazole (DEXILANT) 60 MG capsule Take 60 mg by mouth daily.    Historical Provider, MD  metaxalone (SKELAXIN) 800 MG tablet Take 1 tablet (800 mg total) by mouth 3 (three) times daily. 01/19/15   Lattie HawStephen A Mattelyn Imhoff, MD  predniSONE (DELTASONE) 20 MG tablet Take 1 tablet (20 mg total) by mouth 2 (two) times daily. Take with food. 01/19/15   Lattie HawStephen A Jameela Michna, MD   BP  120/67 mmHg  Pulse 77  Resp 14  Wt 149 lb (67.586 kg)  SpO2 99% Physical Exam  Constitutional: She is oriented to person, place, and time. She appears well-developed and well-nourished. No distress.  HENT:  Head: Atraumatic.  Eyes: Pupils are equal, round, and reactive to light.  Neck: Normal range of motion.  Cardiovascular: Normal heart sounds.   Pulmonary/Chest: Breath sounds normal.  Abdominal: Bowel sounds are normal. There is no tenderness.  Musculoskeletal:       Lumbar back: She exhibits decreased range of motion, tenderness, bony tenderness and pain. She  exhibits no swelling.       Back:  Back:  Decreased range of motion.  Can heel/toe walk and squat without difficulty. Tenderness in the midline and bilateral paraspinous muscles as noted on diagram (L3 and below on the left and L4 and below on the right)     Straight leg raising test is negative.  Sitting knee extension test is negative.  Strength and sensation in the lower extremities is normal.  Patellar and achilles reflexes are normal   Neurological: She is oriented to person, place, and time.  Skin: Skin is warm and dry. No rash noted.  Nursing note and vitals reviewed.   ED Course  Procedures  none  Imaging Review Dg Lumbar Spine Complete  01/19/2015   CLINICAL DATA:  Low back pain, greater on the right, for the past 12 days. No known injury.  EXAM: LUMBAR SPINE - COMPLETE 4+ VIEW  COMPARISON:  Abdomen and pelvis CT dated 05/29/2014.  FINDINGS: Five non-rib-bearing lumbar vertebrae. Interval mild levoconvex thoracolumbar scoliosis. There is also minimal reversal of the normal lumbar lordosis, not previously present. Mild anterior and lateral spur formation at multiple levels. No fractures, pars defects or subluxations. Cholecystectomy clips.  IMPRESSION: 1. Interval mild scoliosis and minimal reversal of the normal lumbar lordosis, compatible with muscle spasm. 2. Mild degenerative changes.   Electronically Signed   By: Beckie SaltsSteven  Reid M.D.   On: 01/19/2015 15:31     MDM   1. Lumbar back pain with radiculopathy affecting right lower extremity    Begin prednisone burst.  Skelaxin TID Apply ice pack for 20 to 30 minutes, 3 to 4 times daily  Continue until pain decreases.  Followup with Dr. Rodney Langtonhomas Thekkekand[facility] ([facility]) if not improving about two weeks.     Lattie HawStephen A Farah Lepak, MD 01/30/15 [phone]

## 2015-01-19 NOTE — Discharge Instructions (Signed)
Apply ice pack for 20 to 30 minutes, 3 to 4 times daily  Continue until pain decreases.  ° ° °Lumbosacral Radiculopathy °Lumbosacral radiculopathy is a pinched nerve or nerves in the low back (lumbosacral area). When this happens you may have weakness in your legs and may not be able to stand on your toes. You may have pain going down into your legs. There may be difficulties with walking normally. There are many causes of this problem. Sometimes this may happen from an injury, or simply from arthritis or boney problems. It may also be caused by other illnesses such as diabetes. If there is no improvement after treatment, further studies may be done to find the exact cause. °DIAGNOSIS  °X-rays may be needed if the problems become long standing. Electromyograms may be done. This study is one in which the working of nerves and muscles is studied. °HOME CARE INSTRUCTIONS  °· Applications of ice packs may be helpful. Ice can be used in a plastic bag with a towel around it to prevent frostbite to skin. This may be used every 2 hours for 20 to 30 minutes, or as needed, while awake, or as directed by your caregiver. °· Only take over-the-counter or prescription medicines for pain, discomfort, or fever as directed by your caregiver. °· If physical therapy was prescribed, follow your caregiver's directions. °SEEK IMMEDIATE MEDICAL CARE IF:  °· You have pain not controlled with medications. °· You seem to be getting worse rather than better. °· You develop increasing weakness in your legs. °· You develop loss of bowel or bladder control. °· You have difficulty with walking or balance, or develop clumsiness in the use of your legs. °· You have a fever. °MAKE SURE YOU:  °· Understand these instructions. °· Will watch your condition. °· Will get help right away if you are not doing well or get worse. °Document Released: 08/29/2005 Document Revised: 11/21/2011 Document Reviewed: 04/18/2008 °[employer]® Patient Information ©2015  [employer]. This information is not intended to replace advice given to you by your health care provider. Make sure you discuss any questions you have with your health care provider. ° °

## 2015-01-19 NOTE — ED Notes (Signed)
Pt c/o low back, right hip and glute pain x 12 days without injury. Chronic/intermittent h/o back and neck pain.

## 2015-03-10 ENCOUNTER — Encounter: Payer: Self-pay | Admitting: Emergency Medicine

## 2015-03-10 ENCOUNTER — Emergency Department (INDEPENDENT_AMBULATORY_CARE_PROVIDER_SITE_OTHER)
Admission: EM | Admit: 2015-03-10 | Discharge: 2015-03-10 | Disposition: A | Discharge status: Home / Self Care | Payer: PRIVATE HEALTH INSURANCE | Source: Home / Self Care | Attending: Family Medicine | Admitting: Family Medicine

## 2015-03-10 DIAGNOSIS — J029 Acute pharyngitis, unspecified: Secondary | ICD-10-CM

## 2015-03-10 LAB — POCT RAPID STREP A (OFFICE): RAPID STREP A SCREEN: NEGATIVE

## 2015-03-10 MED ORDER — CEFDINIR 300 MG PO CAPS: 300.0000 mg | ORAL_CAPSULE | Freq: Two times a day (BID) | ORAL | Status: DC

## 2015-03-10 NOTE — Discharge Instructions (Signed)
If cold symptoms develop, try the following: Take plain guaifenesin (  extended release tabs such as Mucinex) twice daily, with plenty of water, for cough and congestion.  May add Pseudoephedrine ( , one or two every 4 to 6 hours) for sinus congestion.  Get adequate rest.   May use Afrin nasal spray (or generic oxymetazoline) twice daily for about 5 days.  Also recommend using saline nasal spray several times daily and saline nasal irrigation (AYR is a common brand).   Try warm salt water gargles for sore throat.  Stop all antihistamines for now, and other non-prescription cough/cold preparations.   Follow-up with family doctor if not improving about 7 to10 days.

## 2015-03-10 NOTE — ED Provider Notes (Signed)
CSN: [id number]     Arrival date & time 03/10/15  1044 History   First MD Initiated Contact with Patient 03/10/15 1046     Chief Complaint  Patient presents with  . Facial Pain  . Otalgia      HPI Comments: Patient complains of onset of a partly productive cough about 1.5 weeks ago, and a sore throat one week ago.  The sore throat has persisted and become worse during the past two days.  Yesterday she developed pain in her left face, left jaw, and left upper/lower teeth although she does not have pain in her teeth when she chews.  Yesterday she had chills.  The history is provided by the patient.    Past Medical History  Diagnosis Date  . Endometriosis   . Anxiety   . GERD (gastroesophageal reflux disease)   . Depression    Past Surgical History  Procedure Laterality Date  . Abdominal hysterectomy    . Eye surgery     Family History  Problem Relation Age of Onset  . Cancer Mother     breast  . Heart Problems Other   . Heart attack Father    History  Substance Use Topics  . Smoking status: Current Every Day Smoker -- 0.50 packs/day    Types: Cigarettes  . Smokeless tobacco: Never Used  . Alcohol Use: No   OB History    No data available     Review of Systems + sore throat + hoarseness + cough No pleuritic pain No wheezing + nasal congestion + post-nasal drainage No sinus pain/pressure No itchy/red eyes ? earache No hemoptysis No SOB No fever, + chills No nausea No vomiting No abdominal pain No diarrhea No urinary symptoms No skin rash + fatigue No myalgias + headache Used OTC meds without relief  Allergies  Codeine; Eggs or egg-derived products; Hydrocodone-acetaminophen; Naproxen; Penicillins; and Sulfamethoxazole-trimethoprim  Home Medications   Prior to Admission medications   Medication Sig Start Date End Date Taking? Authorizing Provider  aspirin 81 MG tablet Take 81 mg by mouth daily.    Historical Provider, MD  cefdinir (OMNICEF) 300 MG  capsule Take 1 capsule (300 mg total) by mouth 2 (two) times daily. 03/10/15   Lattie HawStephen A Tatem Fesler, MD  cetirizine (ZYRTEC) 10 MG tablet Take 10 mg by mouth daily.    Historical Provider, MD  citalopram (CELEXA) 20 MG tablet Take 20 mg by mouth daily.    Historical Provider, MD  dexlansoprazole (DEXILANT) 60 MG capsule Take 60 mg by mouth daily.    Historical Provider, MD  estradiol (VIVELLE-DOT) 0.075 MG/24HR Place 1 patch onto the skin 2 (two) times a week.    Historical Provider, MD  famotidine (PEPCID) 40 MG tablet Take 40 mg by mouth daily.    Historical Provider, MD   BP 120/72 mmHg  Pulse 69  Temp(Src) 97.9 F (36.6 C) (Oral)  Resp 16  Ht 5\' 4"  (1.626 m)  Wt 150 lb (68.04 kg)  BMI 25.73 kg/m2  SpO2 99% Physical Exam Nursing notes and Vital Signs reviewed. Appearance:  Patient appears stated age, and in no acute distress Eyes:  Pupils are equal, round, and reactive to light and accomodation.  Extraocular movement is intact.  Conjunctivae are not inflamed  Ears:  Canals normal.  Tympanic membranes normal.  Nose:  Mildly congested turbinates.  No sinus tenderness.   Pharynx:  Uvula slightly edematous Neck:  Supple.  Slightly enlarged tender left tonsillar node.  Enlarged tender  left posterior nodes are palpated. Lungs:  Clear to auscultation.  Breath sounds are equal.  Heart:  Regular rate and rhythm without murmurs, rubs, or gallops.  Abdomen:  Nontender without masses or hepatosplenomegaly.  Bowel sounds are present.  No CVA or flank tenderness.  Extremities:  No edema.  No calf tenderness Skin:  No rash present.   ED Course  Procedures none    Labs Reviewed  STREP A DNA PROBE  POCT RAPID STREP A (OFFICE) negative      MDM   1. Acute pharyngitis, unspecified pharyngitis type; ?developing viral URI    Throat culture pending. Begin empiric Omnicef (patient notes that she has taken Keflex without adverse effects). If cold symptoms develop, try the following: Take plain  guaifenesin (  extended release tabs such as Mucinex) twice daily, with plenty of water, for cough and congestion.  May add Pseudoephedrine ( , one or two every 4 to 6 hours) for sinus congestion.  Get adequate rest.   May use Afrin nasal spray (or generic oxymetazoline) twice daily for about 5 days.  Also recommend using saline nasal spray several times daily and saline nasal irrigation (AYR is a common brand).   Try warm salt water gargles for sore throat.  Stop all antihistamines for now, and other non-prescription cough/cold preparations.   Follow-up with family doctor if not improving about 7 to10 days.     Lattie Haw, MD 03/10/15 [phone]

## 2015-03-10 NOTE — ED Notes (Signed)
Reports gradual build up sinus congestion with pain on left side of face/jaw/ear; today it is painful to bite down.

## 2015-03-11 LAB — STREP A DNA PROBE: GASP: NEGATIVE

## 2015-03-13 ENCOUNTER — Telehealth: Payer: Self-pay | Admitting: Emergency Medicine

## 2015-08-21 ENCOUNTER — Emergency Department (INDEPENDENT_AMBULATORY_CARE_PROVIDER_SITE_OTHER)
Admission: EM | Admit: 2015-08-21 | Discharge: 2015-08-21 | Disposition: A | Discharge status: Home / Self Care | Payer: PRIVATE HEALTH INSURANCE | Source: Home / Self Care | Attending: Family Medicine | Admitting: Family Medicine

## 2015-08-21 DIAGNOSIS — H8110 Benign paroxysmal vertigo, unspecified ear: Secondary | ICD-10-CM

## 2015-08-21 DIAGNOSIS — J069 Acute upper respiratory infection, unspecified: Secondary | ICD-10-CM

## 2015-08-21 DIAGNOSIS — B9789 Other viral agents as the cause of diseases classified elsewhere: Secondary | ICD-10-CM

## 2015-08-21 HISTORY — DX: Type 2 diabetes mellitus without complications: E11.9

## 2015-08-21 MED ORDER — BENZONATATE 200 MG PO CAPS: 200.0000 mg | ORAL_CAPSULE | Freq: Every day | ORAL | Status: DC

## 2015-08-21 MED ORDER — AZITHROMYCIN 250 MG PO TABS: ORAL_TABLET | ORAL | Status: DC

## 2015-08-21 MED ORDER — MECLIZINE HCL 25 MG PO TABS: 25.0000 mg | ORAL_TABLET | Freq: Three times a day (TID) | ORAL | Status: DC | PRN

## 2015-08-21 NOTE — Discharge Instructions (Signed)
Take plain guaifenesin (1200mg extended release tabs such as Mucinex) twice daily, with plenty of water, for cough and congestion.  May add Pseudoephedrine (30mg, one or two every 4 to 6 hours) for sinus congestion.  Get adequate rest.   °May use Afrin nasal spray (or generic oxymetazoline) twice daily for about 5 days and then discontinue.  Also recommend using saline nasal spray several times daily and saline nasal irrigation (AYR is a common brand).    °Try warm salt water gargles for sore throat.  °Stop all antihistamines for now, and other non-prescription cough/cold preparations.. °Begin Azithromycin if not improving about one week or if persistent fever develops  °Follow-up with family doctor if not improving about10 days.  °

## 2015-08-21 NOTE — ED Provider Notes (Signed)
CSN: [id number]     Arrival date & time 08/21/15  1501 History   First MD Initiated Contact with Patient 08/21/15 1547     Chief Complaint  Patient presents with  . Dizziness      HPI Comments: About two weeks ago patient developed typical cold-like symptoms including mild sore throat, sinus congestion, headache, fatigue, chills, and cough.  She feels tight in her anterior chest, but no pleuritic pain.  Her symptoms have persisted, and today she has developed mild dizziness as if being "off balance."  The history is provided by the patient.    Past Medical History  Diagnosis Date  . Endometriosis   . Anxiety   . GERD (gastroesophageal reflux disease)   . Depression   . Diabetes mellitus without complication (HCC)     Told borderline by MD   Past Surgical History  Procedure Laterality Date  . Abdominal hysterectomy    . Eye surgery    . Cholecystectomy    . Laparoscopic abdominal exploration      several prior to Hysterectomy   Family History  Problem Relation Age of Onset  . Cancer Mother     breast  . Heart Problems Other   . Heart attack Father    Social History  Substance Use Topics  . Smoking status: Current Every Day Smoker -- 0.50 packs/day    Types: Cigarettes  . Smokeless tobacco: Never Used  . Alcohol Use: No   OB History    No data available     Review of Systems + sore throat + hoarse + cough No pleuritic pain ? wheezing + nasal congestion + post-nasal drainage No sinus pain/pressure No itchy/red eyes ? earache No hemoptysis + SOB with exertion No fever, + chills No nausea No vomiting No abdominal pain No diarrhea No urinary symptoms No skin rash + fatigue + myalgias + headache Used OTC meds without relief  Allergies  Codeine; Eggs or egg-derived products; Hydrocodone-acetaminophen; Naproxen; Penicillins; and Sulfamethoxazole-trimethoprim  Home Medications   Prior to Admission medications   Medication Sig Start Date End Date  Taking? Authorizing Provider  aspirin 81 MG tablet Take 81 mg by mouth daily.    Historical Provider, MD  azithromycin (ZITHROMAX Z-PAK) 250 MG tablet Take 2 tabs today; then begin one tab once daily for 4 more days. (Rx void after 08/29/15) 08/21/15   Lattie Haw, MD  benzonatate (TESSALON) 200 MG capsule Take 1 capsule (200 mg total) by mouth at bedtime. Take as needed for cough 08/21/15   Lattie Haw, MD  cetirizine (ZYRTEC) 10 MG tablet Take 10 mg by mouth daily.    Historical Provider, MD  citalopram (CELEXA) 20 MG tablet Take 20 mg by mouth daily.    Historical Provider, MD  dexlansoprazole (DEXILANT) 60 MG capsule Take 60 mg by mouth daily.    Historical Provider, MD  estradiol (VIVELLE-DOT) 0.075 MG/24HR Place 1 patch onto the skin 2 (two) times a week.    Historical Provider, MD  famotidine (PEPCID) 40 MG tablet Take 40 mg by mouth daily.    Historical Provider, MD  meclizine (ANTIVERT) 25 MG tablet Take 1 tablet (25 mg total) by mouth 3 (three) times daily as needed for dizziness. 08/21/15   Lattie Haw, MD   Meds Ordered and Administered this Visit  Medications - No data to display  BP 117/73 mmHg  Pulse 70  Temp(Src) 97.4 F (36.3 C) (Oral)  Ht  (1.626 m)  Wt  150 lb 12 oz (68.38 kg)  BMI 25.86 kg/m2  SpO2 98% No data found.   Physical Exam Nursing notes and Vital Signs reviewed. Appearance:  Patient appears stated age, and in no acute distress Eyes:  Pupils are equal, round, and reactive to light and accomodation.  Extraocular movement is intact.  Conjunctivae are not inflamed.  There is nystagmus to the right  Ears:  Canals normal.  Tympanic membranes normal.  Nose:  Congested turbinates.  No sinus tenderness.    Pharynx:  Uvula is edematous, otherwise normal Neck:  Supple.  Tender enlarged posterior nodes are palpated bilaterally  Lungs:  Clear to auscultation.  Breath sounds are equal.  Moving air well. Heart:  Regular rate and rhythm without murmurs,  rubs, or gallops.  Abdomen:  Nontender without masses or hepatosplenomegaly.  Bowel sounds are present.  No CVA or flank tenderness.  Extremities:  No edema.  No calf tenderness Skin:  No rash present. Neurologic:  Cranial nerves 2 through 12 are normal.     ED Course  Procedures  None   MDM   1. Benign paroxysmal positional vertigo, unspecified laterality   2. Viral URI with cough    There is no evidence of bacterial infection today.  Treat symptomatically for now  Rx for Antivert.   Prescription written for Benzonatate [facility](Tessalon) to take at bedtime for night-time cough.  Take plain guaifenesin (1200mg  extended release tabs such as Mucinex) twice daily, with plenty of water, for cough and congestion.  May add Pseudoephedrine (30mg , one or two every 4 to 6 hours) for sinus congestion.  Get adequate rest.   May use Afrin nasal spray (or generic oxymetazoline) twice daily for about 5 days and then discontinue.  Also recommend using saline nasal spray several times daily and saline nasal irrigation (AYR is a common brand).  Try warm salt water gargles for sore throat.  Stop all antihistamines for now, and other non-prescription cough/cold preparations. Begin Azithromycin if not improving about one week or if persistent fever develops (Given a prescription to hold, with an expiration date)  Follow-up with family doctor if not improving about10 days.     Lattie HawStephen A Beese, MD 08/27/15 2017

## 2015-08-21 NOTE — ED Notes (Signed)
Started a couple of weeks ago with a sore throat, dry nose, coughing.  Today dizziness started.

## 2016-06-30 ENCOUNTER — Emergency Department (INDEPENDENT_AMBULATORY_CARE_PROVIDER_SITE_OTHER): Payer: No Typology Code available for payment source

## 2016-06-30 ENCOUNTER — Emergency Department (INDEPENDENT_AMBULATORY_CARE_PROVIDER_SITE_OTHER)
Admission: EM | Admit: 2016-06-30 | Discharge: 2016-06-30 | Disposition: A | Discharge status: Home / Self Care | Payer: PRIVATE HEALTH INSURANCE | Source: Home / Self Care | Attending: Family Medicine | Admitting: Family Medicine

## 2016-06-30 ENCOUNTER — Encounter: Payer: Self-pay | Admitting: *Deleted

## 2016-06-30 DIAGNOSIS — R5383 Other fatigue: Secondary | ICD-10-CM

## 2016-06-30 DIAGNOSIS — J9801 Acute bronchospasm: Secondary | ICD-10-CM | POA: Diagnosis not present

## 2016-06-30 DIAGNOSIS — R05 Cough: Secondary | ICD-10-CM

## 2016-06-30 DIAGNOSIS — R053 Chronic cough: Secondary | ICD-10-CM

## 2016-06-30 LAB — POCT CBC W AUTO DIFF (K'VILLE URGENT CARE)

## 2016-06-30 MED ORDER — DOXYCYCLINE HYCLATE 100 MG PO CAPS: 100.0000 mg | ORAL_CAPSULE | Freq: Two times a day (BID) | ORAL | 0 refills | Status: DC

## 2016-06-30 MED ORDER — BENZONATATE 200 MG PO CAPS: 200.0000 mg | ORAL_CAPSULE | Freq: Every day | ORAL | 0 refills | Status: DC

## 2016-06-30 MED ORDER — PREDNISONE 20 MG PO TABS: ORAL_TABLET | ORAL | 0 refills | Status: DC

## 2016-06-30 NOTE — Discharge Instructions (Signed)
Take plain guaifenesin (1200mg  extended release tabs such as Mucinex) twice daily, with plenty of water, for cough and congestion.  May add Pseudoephedrine (30mg , one or two every 4 to 6 hours) for sinus congestion.  Get adequate rest.   May use Afrin nasal spray (or generic oxymetazoline) twice daily for about 5 days and then discontinue.  Also recommend using saline nasal spray several times daily and saline nasal irrigation (AYR is a common brand).  Use Flonase nasal spray each morning after using Afrin nasal spray and saline nasal irrigation. Stop all antihistamines for now, and other non-prescription cough/cold preparations.   Follow-up with family doctor if not improving about 7 to 10 days.

## 2016-06-30 NOTE — ED Provider Notes (Signed)
[facility]    CSN: [id number] Arrival date & time: 06/30/16  1618     History   Chief Complaint Chief Complaint  Patient presents with  . Cough  . Shoulder Pain    HPI Ashley Lynn is a 50 y.o. female.   Patient complains of persistent non-productive cough and fatigue for about 4 to 6 weeks.  Her cough is productive and worse in the mornings.  She has occasional wheezing.  She believes that she has had chills at times.  She denies fever.  No weight loss.  She has GERD for which she presently takes Zantac 150mg  daily. She is followed by Dr. Mathis Bud for goiter; her last thyroid function studies were normal 07/15/15. She complains of right shoulder pain without history of injury, but has decided to follow-up with her PCP for this problem.   The history is provided by the patient.    Past Medical History:  Diagnosis Date  . Anxiety   . Depression   . Diabetes mellitus without complication (HCC)    Told borderline by MD  . Endometriosis   . GERD (gastroesophageal reflux disease)     Patient Active Problem List   Diagnosis Date Noted  . ALLERGIC RHINITIS 09/28/2010  . GERD 09/28/2010  . WRIST PAIN, RIGHT 09/28/2010  . 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 ABDOMINAL HYSTERECTOMY    . CHOLECYSTECTOMY    . EYE SURGERY    . LAPAROSCOPIC ABDOMINAL EXPLORATION     several prior to Hysterectomy    OB History    No data available       Home Medications    Prior to Admission medications   Medication Sig Start Date End Date Taking? Authorizing Provider  aspirin 81 MG tablet Take 81 mg by mouth daily.    Historical Provider, MD  benzonatate (TESSALON) 200 MG capsule Take 1 capsule (200 mg total) by mouth at bedtime. Take as needed for cough 06/30/16   Lattie Haw, MD  cetirizine (ZYRTEC) 10 MG tablet Take 10 mg by mouth daily.    Historical Provider, MD  citalopram (CELEXA) 20 MG tablet  Take 20 mg by mouth daily.    Historical Provider, MD  dexlansoprazole (DEXILANT) 60 MG capsule Take 60 mg by mouth daily.    Historical Provider, MD  doxycycline (VIBRAMYCIN) 100 MG capsule Take 1 capsule (100 mg total) by mouth 2 (two) times daily. Take with food. 06/30/16   Lattie Haw, MD  estradiol (VIVELLE-DOT) 0.075 MG/24HR Place 1 patch onto the skin 2 (two) times a week.    Historical Provider, MD  famotidine (PEPCID) 40 MG tablet Take 40 mg by mouth daily.    Historical Provider, MD  meclizine (ANTIVERT) 25 MG tablet Take 1 tablet (25 mg total) by mouth 3 (three) times daily as needed for dizziness. 08/21/15   Lattie Haw, MD  predniSONE (DELTASONE) 20 MG tablet Take one tab by mouth twice daily for 5 days, then one daily. Take with food. 06/30/16   Lattie Haw, MD    Family History Family History  Problem Relation Age of Onset  . Cancer Mother     breast  . Heart Problems Other   . Heart attack Father     Social History Social History  Substance Use Topics  . Smoking status: Current Every Day Smoker    Packs/day: 0.50    Types: Cigarettes  . Smokeless  tobacco: Never Used  . Alcohol use No     Allergies   Codeine; Eggs or egg-derived products; Hydrocodone-acetaminophen; Naproxen; Penicillins; and Sulfamethoxazole-trimethoprim   Review of Systems Review of Systems  No sore throat + cough No pleuritic pain + wheezing + nasal congestion + post-nasal drainage No sinus pain/pressure No itchy/red eyes No earache No hemoptysis No SOB No fever, ? chills No nausea No vomiting No abdominal pain No diarrhea No urinary symptoms No skin rash + fatigue No myalgias + headache Used OTC meds without relief    Physical Exam Triage Vital Signs ED Triage Vitals  Enc Vitals Group     BP 06/30/16 1637 123/73     Pulse Rate 06/30/16 1637 80     Resp 06/30/16 1637 14     Temp 06/30/16 1637 97.8 F (36.6 C)     Temp Source 06/30/16 1637 Oral     SpO2  06/30/16 1637 99 %     Weight 06/30/16 1637 151 lb (68.5 kg)     Height --      Head Circumference --      Peak Flow --      Pain Score 06/30/16 1639 0     Pain Loc --      Pain Edu? --      Excl. in GC? --    No data found.   Updated Vital Signs BP 123/73 (BP Location: Left Arm)   Pulse 80   Temp 97.8 F (36.6 C) (Oral)   Resp 14   Wt 151 lb (68.5 kg)   SpO2 99%   BMI 25.92 kg/m   Visual Acuity Right Eye Distance:   Left Eye Distance:   Bilateral Distance:    Right Eye Near:   Left Eye Near:    Bilateral Near:     Physical Exam Nursing notes and Vital Signs reviewed. Appearance:  Patient appears stated age, and in no acute distress Eyes:  Pupils are equal, round, and reactive to light and accomodation.  Extraocular movement is intact.  Conjunctivae are not inflamed  Ears:  Canals normal.  Tympanic membranes normal.  Nose:  Mildly congested turbinates.  No sinus tenderness.     Pharynx:  Normal Neck:  Supple.  Tender enlarged posterior/lateral nodes are palpated bilaterally.  No thyromegaly.  Lungs:  Clear to auscultation.  Breath sounds are equal.  Moving air well. Heart:  Regular rate and rhythm without murmurs, rubs, or gallops.  Abdomen:  Nontender without masses or hepatosplenomegaly.  Bowel sounds are present.  No CVA or flank tenderness.  Extremities:  No edema.  Skin:  No rash present.    UC Treatments / Results  Labs (all labs ordered are listed, but only abnormal results are displayed) Labs Reviewed  TSH  POCT CBC W AUTO DIFF ([facility]):  WBC 5.3; LY 45.8; MO 9.1; GR 45.1; Hgb 13.6; Platelets 208     EKG  EKG Interpretation None       Radiology Dg Chest 2 View  Result Date: 06/30/2016 CLINICAL DATA:  Cough and fatigue for 6 weeks EXAM: CHEST  2 VIEW COMPARISON:  November 07, 2014 FINDINGS: There is no edema or consolidation. The heart size and pulmonary vascularity are normal. No adenopathy. No bone lesions. IMPRESSION: No edema  or consolidation. Electronically Signed   By: Bretta Bang III M.D.   On: 06/30/2016 17:23    Procedures Procedures (including critical care time)  Medications Ordered in UC Medications - No data to  display   Initial Impression / Assessment and Plan / UC Course  I have reviewed the triage vital signs and the nursing notes.  Pertinent labs & imaging results that were available during my care of the patient were reviewed by me and considered in my medical decision making (see chart for details).  Clinical Course  TSH pending.  Normal chest X-ray, WBC and Hgb reassuring. Begin empiric doxycycline for atypical coverage.  Begin prednisone burst. Prescription written for Benzonatate (Tessalon) to take at bedtime for night-time cough.  Begin prednisone burst/taper. Take plain guaifenesin (1200mg  extended release tabs such as Mucinex) twice daily, with plenty of water, for cough and congestion.  May add Pseudoephedrine (30mg , one or two every 4 to 6 hours) for sinus congestion.  Get adequate rest.   May use Afrin nasal spray (or generic oxymetazoline) twice daily for about 5 days and then discontinue.  Also recommend using saline nasal spray several times daily and saline nasal irrigation (AYR is a common brand).  Use Flonase nasal spray each morning after using Afrin nasal spray and saline nasal irrigation. Stop all antihistamines for now, and other non-prescription cough/cold preparations.   Follow-up with family doctor if not improving about 7 to 10 days.      Final Clinical Impressions(s) / UC Diagnoses   Final diagnoses:  Persistent cough  Other fatigue ?etiology  Bronchospasm    New Prescriptions New Prescriptions   DOXYCYCLINE (VIBRAMYCIN) 100 MG CAPSULE    Take 1 capsule (100 mg total) by mouth 2 (two) times daily. Take with food.   PREDNISONE (DELTASONE) 20 MG TABLET    Take one tab by mouth twice daily for 5 days, then one daily. Take with food.     Lattie HawStephen A Beese,  MD 07/07/16 1420

## 2016-06-30 NOTE — ED Triage Notes (Signed)
Pt c/o cough and fatigue x 4-6 weeks, worse @ night and productive at times. Reports h/o reflux.She also c/o right shoulder pain without injury.

## 2016-07-01 ENCOUNTER — Telehealth: Payer: Self-pay | Admitting: *Deleted

## 2016-07-01 LAB — TSH: TSH: 2.14 mIU/L

## 2016-07-01 NOTE — Telephone Encounter (Signed)
LM with TSH results and to call back if she has any questions or concerns. Clemens Catholichristy Edmund Rick, LPN

## 2016-10-14 ENCOUNTER — Encounter: Payer: Self-pay | Admitting: Emergency Medicine

## 2016-10-14 ENCOUNTER — Emergency Department (INDEPENDENT_AMBULATORY_CARE_PROVIDER_SITE_OTHER)
Admission: EM | Admit: 2016-10-14 | Discharge: 2016-10-14 | Disposition: A | Discharge status: Home / Self Care | Payer: No Typology Code available for payment source | Source: Home / Self Care | Attending: Family Medicine | Admitting: Family Medicine

## 2016-10-14 ENCOUNTER — Emergency Department (INDEPENDENT_AMBULATORY_CARE_PROVIDER_SITE_OTHER): Payer: No Typology Code available for payment source

## 2016-10-14 DIAGNOSIS — J9801 Acute bronchospasm: Secondary | ICD-10-CM

## 2016-10-14 DIAGNOSIS — R05 Cough: Secondary | ICD-10-CM | POA: Diagnosis not present

## 2016-10-14 DIAGNOSIS — R053 Chronic cough: Secondary | ICD-10-CM

## 2016-10-14 LAB — POCT CBC W AUTO DIFF (K'VILLE URGENT CARE)

## 2016-10-14 MED ORDER — DOXYCYCLINE HYCLATE 100 MG PO CAPS: 100.0000 mg | ORAL_CAPSULE | Freq: Two times a day (BID) | ORAL | 0 refills | Status: DC

## 2016-10-14 MED ORDER — MONTELUKAST SODIUM 10 MG PO TABS: 10.0000 mg | ORAL_TABLET | Freq: Every day | ORAL | 1 refills | Status: DC

## 2016-10-14 MED ORDER — BENZONATATE 200 MG PO CAPS: ORAL_CAPSULE | ORAL | 0 refills | Status: DC

## 2016-10-14 MED ORDER — PREDNISONE 20 MG PO TABS: ORAL_TABLET | ORAL | 0 refills | Status: DC

## 2016-10-14 NOTE — ED Triage Notes (Signed)
Productive cough x 1 month getting worse, sore throat, chest tightness on left side, night sweats, fatigue, headache. Grandchild was dx'd with Flu 2 days ago.

## 2016-10-14 NOTE — ED Provider Notes (Signed)
[facility]    CSN: [id number] Arrival date & time: 10/14/16  1355     History   Chief Complaint Chief Complaint  Patient presents with  . Cough    HPI Ashley Lynn is a 51 y.o. female.   Patient complains of persistent mild cough for about a month.  She has been fatigued for about two weeks with intermittent myalgias and mild sore throat.  During the past 2 to 3 days she has been more winded with activity, with wheezing at night, and night sweats. She has a history of seasonal rhinitis, and a family history of asthma (brother).   The history is provided by the patient.    Past Medical History:  Diagnosis Date  . Anxiety   . Depression   . Diabetes mellitus without complication (HCC)    Told borderline by MD  . Endometriosis   . GERD (gastroesophageal reflux disease)     Patient Active Problem List   Diagnosis Date Noted  . ALLERGIC RHINITIS 09/28/2010  . GERD 09/28/2010  . WRIST PAIN, RIGHT 09/28/2010  . 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 ABDOMINAL HYSTERECTOMY    . CHOLECYSTECTOMY    . EYE SURGERY    . LAPAROSCOPIC ABDOMINAL EXPLORATION     several prior to Hysterectomy    OB History    No data available       Home Medications    Prior to Admission medications   Medication Sig Start Date End Date Taking? Authorizing Provider  aspirin 81 MG tablet Take 81 mg by mouth daily.    Historical Provider, MD  benzonatate (TESSALON) 200 MG capsule Take one cap by mouth at bedtime as needed for cough.  May repeat in 4 to 6 hours 10/14/16   Lattie Haw, MD  cetirizine (ZYRTEC) 10 MG tablet Take 10 mg by mouth daily.    Historical Provider, MD  citalopram (CELEXA) 20 MG tablet Take 20 mg by mouth daily.    Historical Provider, MD  doxycycline (VIBRAMYCIN) 100 MG capsule Take 1 capsule (100 mg total) by mouth 2 (two) times daily. Take with food. 10/14/16   Lattie Haw, MD  estradiol  (VIVELLE-DOT) 0.075 MG/24HR Place 1 patch onto the skin 2 (two) times a week.    Historical Provider, MD  famotidine (PEPCID) 40 MG tablet Take 40 mg by mouth daily.    Historical Provider, MD  montelukast (SINGULAIR) 10 MG tablet Take 1 tablet (10 mg total) by mouth at bedtime. 10/14/16   Lattie Haw, MD  predniSONE (DELTASONE) 20 MG tablet Take one tab by mouth twice daily for 5 days, then one daily. Take with food. 10/14/16   Lattie Haw, MD    Family History Family History  Problem Relation Age of Onset  . Cancer Mother     breast  . Heart Problems Other   . Heart attack Father     Social History Social History  Substance Use Topics  . Smoking status: Current Every Day Smoker    Packs/day: 1.00    Years: 35.00    Types: Cigarettes  . Smokeless tobacco: Never Used  . Alcohol use No     Allergies   Codeine; Eggs or egg-derived products; Hydrocodone-acetaminophen; Naproxen; Penicillins; and Sulfamethoxazole-trimethoprim   Review of Systems Review of Systems ? sore throat + cough No pleuritic pain +wheezing + nasal congestion + post-nasal drainage No sinus pain/pressure No itchy/red eyes  No earache No hemoptysis No SOB No fever, + chills/sweats No nausea No vomiting No abdominal pain No diarrhea No urinary symptoms No skin rash + fatigue + myalgias + headache Used OTC meds without relief   Physical Exam Triage Vital Signs ED Triage Vitals  Enc Vitals Group     BP 10/14/16 1415 104/67     Pulse Rate 10/14/16 1415 71     Resp --      Temp 10/14/16 1415 98.3 F (36.8 C)     Temp Source 10/14/16 1415 Oral     SpO2 10/14/16 1415 99 %     Weight 10/14/16 1415 152 lb (68.9 kg)     Height 10/14/16 1415 5\' 4"  (1.626 m)     Head Circumference --      Peak Flow --      Pain Score 10/14/16 1418 5     Pain Loc --      Pain Edu? --      Excl. in GC? --    No data found.   Updated Vital Signs BP 104/67 (BP Location: Left Arm)   Pulse 71   Temp  98.3 F (36.8 C) (Oral)   Ht 5\' 4"  (1.626 m)   Wt 152 lb (68.9 kg)   SpO2 99%   BMI 26.09 kg/m   Visual Acuity Right Eye Distance:   Left Eye Distance:   Bilateral Distance:    Right Eye Near:   Left Eye Near:    Bilateral Near:     Physical Exam Nursing notes and Vital Signs reviewed. Appearance:  Patient appears stated age, and in no acute distress Eyes:  Pupils are equal, round, and reactive to light and accomodation.  Extraocular movement is intact.  Conjunctivae are not inflamed  Ears:  Canals normal.  Tympanic membranes normal.  Nose:  Mildly congested turbinates.  No sinus tenderness.    Pharynx:  Normal Neck:  Supple.  Tender posterior/lateral nodes bilaterally. Lungs:  Clear to auscultation.  Breath sounds are equal.  Moving air well. Heart:  Regular rate and rhythm without murmurs, rubs, or gallops.  Abdomen:  Nontender without masses or hepatosplenomegaly.  Bowel sounds are present.  No CVA or flank tenderness.  Extremities:  No edema.  Skin:  No rash present.    UC Treatments / Results  Labs (all labs ordered are listed, but only abnormal results are displayed) Labs Reviewed  POCT CBC W AUTO DIFF ([facility])  WBC 9.2; LY 46.3; MO 5.4; GR 48.3; Hgb 13.4; Platelets 216     EKG  EKG Interpretation None       Radiology Dg Chest 2 View  Result Date: 10/14/2016 CLINICAL DATA:  Cough. EXAM: CHEST  2 VIEW COMPARISON:  06/30/2016 . FINDINGS: Mediastinum and hilar structures are normal. Heart size normal. No focal alveolar infiltrate. No pleural effusion or pneumothorax. No acute bony abnormality . IMPRESSION: Stable chest.  No acute cardiopulmonary disease. Electronically Signed   By: Maisie Fushomas  Register   On: 10/14/2016 14:31    Procedures Procedures (including critical care time)  Medications Ordered in UC Medications - No data to display   Initial Impression / Assessment and Plan / UC Course  I have reviewed the triage vital signs and the nursing  notes.  Pertinent labs & imaging results that were available during my care of the patient were reviewed by me and considered in my medical decision making (see chart for details).    Normal chest X-ray and CBC  reassuring. Suspect reactive airways disease.  Begin prednisone burst/taper.  Begin trial of Singulair. Will cover for possible bacterial bronchitis with doxycycline 100mg  BID. Prescription written for Benzonatate [facility]) to take at bedtime for night-time cough.  Take plain guaifenesin (1200mg  extended release tabs such as Mucinex) twice daily, with plenty of water, for cough and congestion.  Get adequate rest.   Stop all antihistamines for now, and other non-prescription cough/cold preparations. Followup with Family Doctor if not improved in about 10 days.    Final Clinical Impressions(s) / UC Diagnoses   Final diagnoses:  Cough, persistent  Bronchospasm    New Prescriptions New Prescriptions   BENZONATATE (TESSALON) 200 MG CAPSULE    Take one cap by mouth at bedtime as needed for cough.  May repeat in 4 to 6 hours   DOXYCYCLINE (VIBRAMYCIN) 100 MG CAPSULE    Take 1 capsule (100 mg total) by mouth 2 (two) times daily. Take with food.   MONTELUKAST (SINGULAIR) 10 MG TABLET    Take 1 tablet (10 mg total) by mouth at bedtime.   PREDNISONE (DELTASONE) 20 MG TABLET    Take one tab by mouth twice daily for 5 days, then one daily. Take with food.     Lattie Haw, MD 10/14/16 2033

## 2016-10-14 NOTE — Discharge Instructions (Signed)
Take plain guaifenesin (1200mg extended release tabs such as Mucinex) twice daily, with plenty of water, for cough and congestion.  Get adequate rest.   °Stop all antihistamines for now, and other non-prescription cough/cold preparations. °  ° °

## 2017-06-02 IMAGING — DX DG CHEST 2V
2 series · 2 of 2 positions shown · non-contrast
Comparison: 06/30/2016 .

CLINICAL DATA: Cough.

EXAM:
CHEST  2 VIEW

[chest pa]
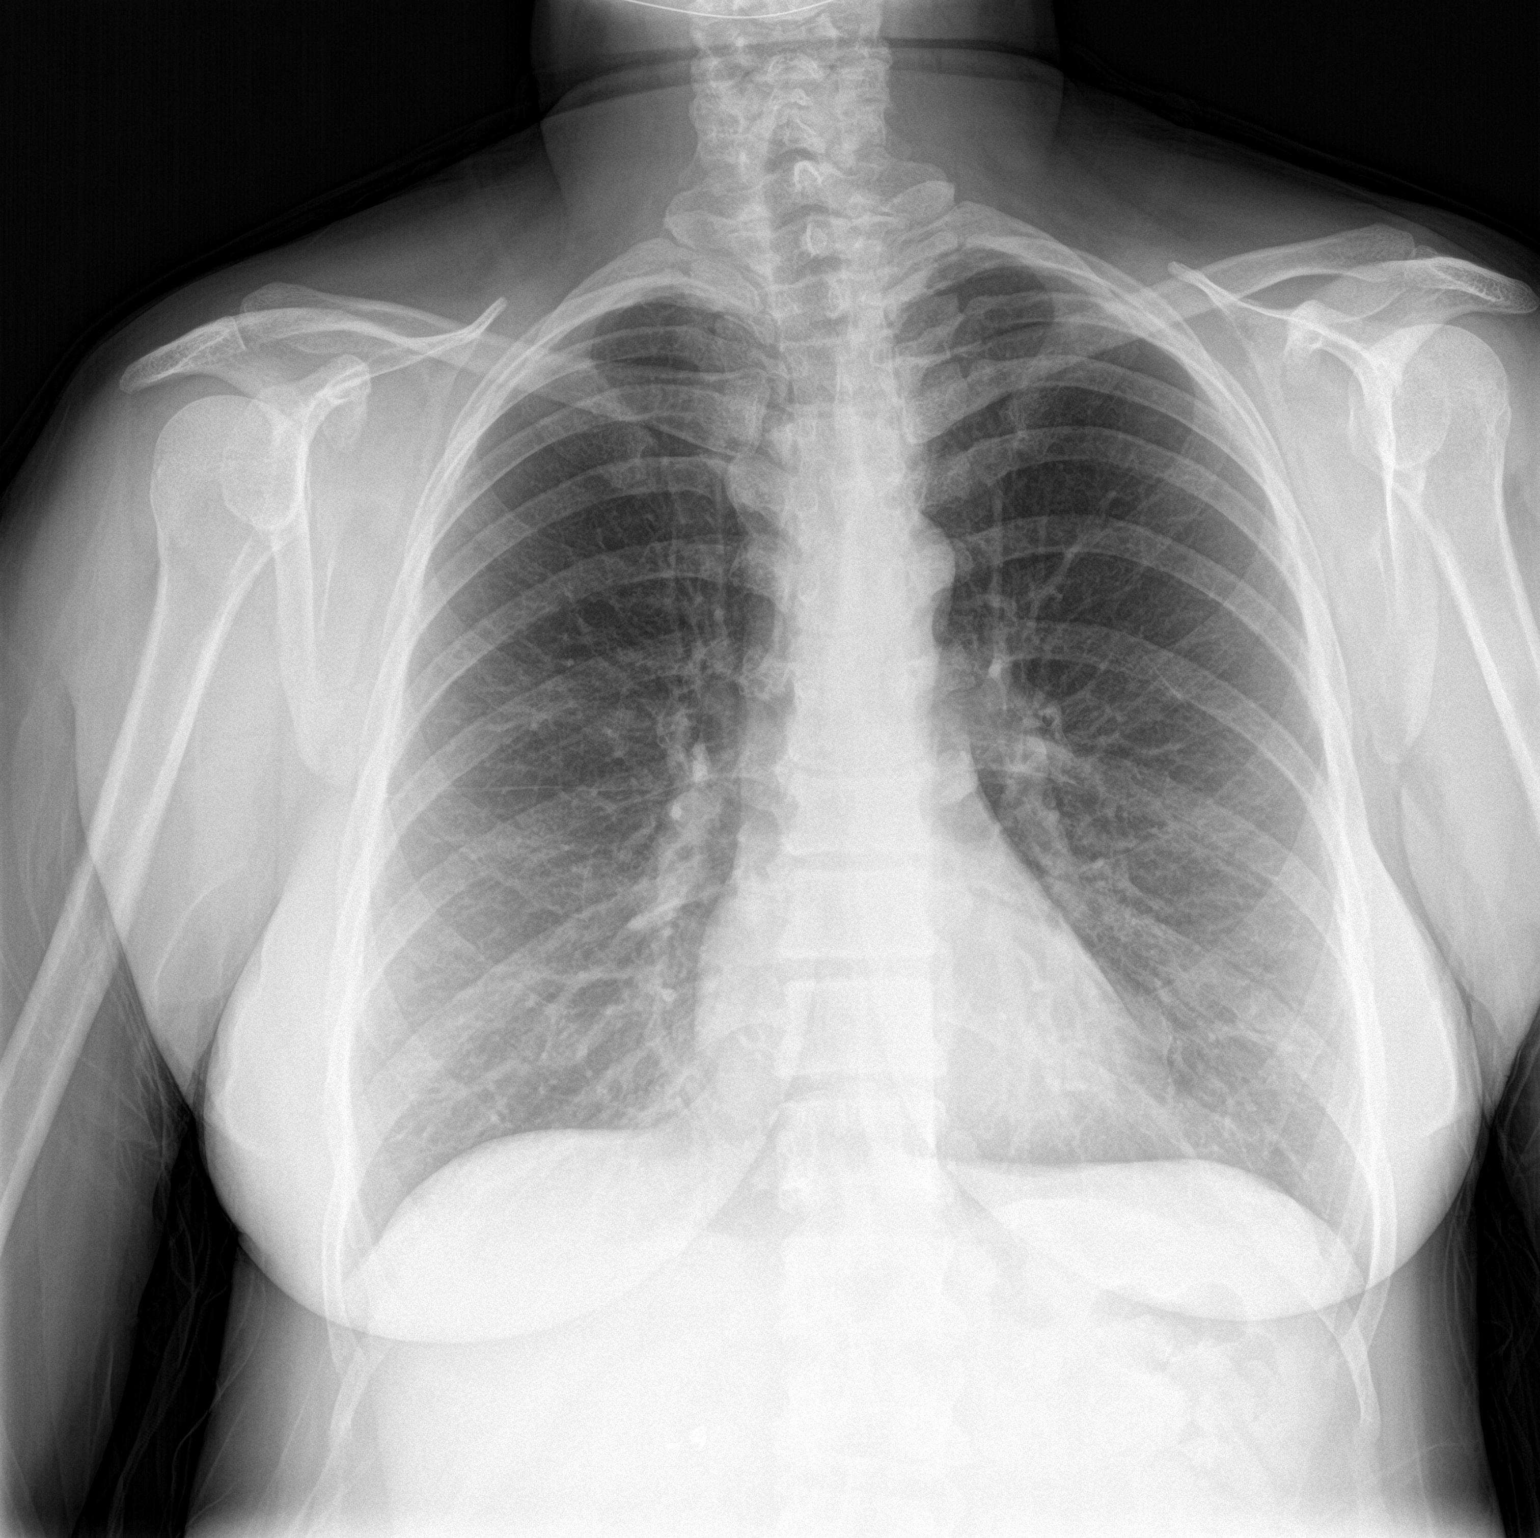

[chest lat]
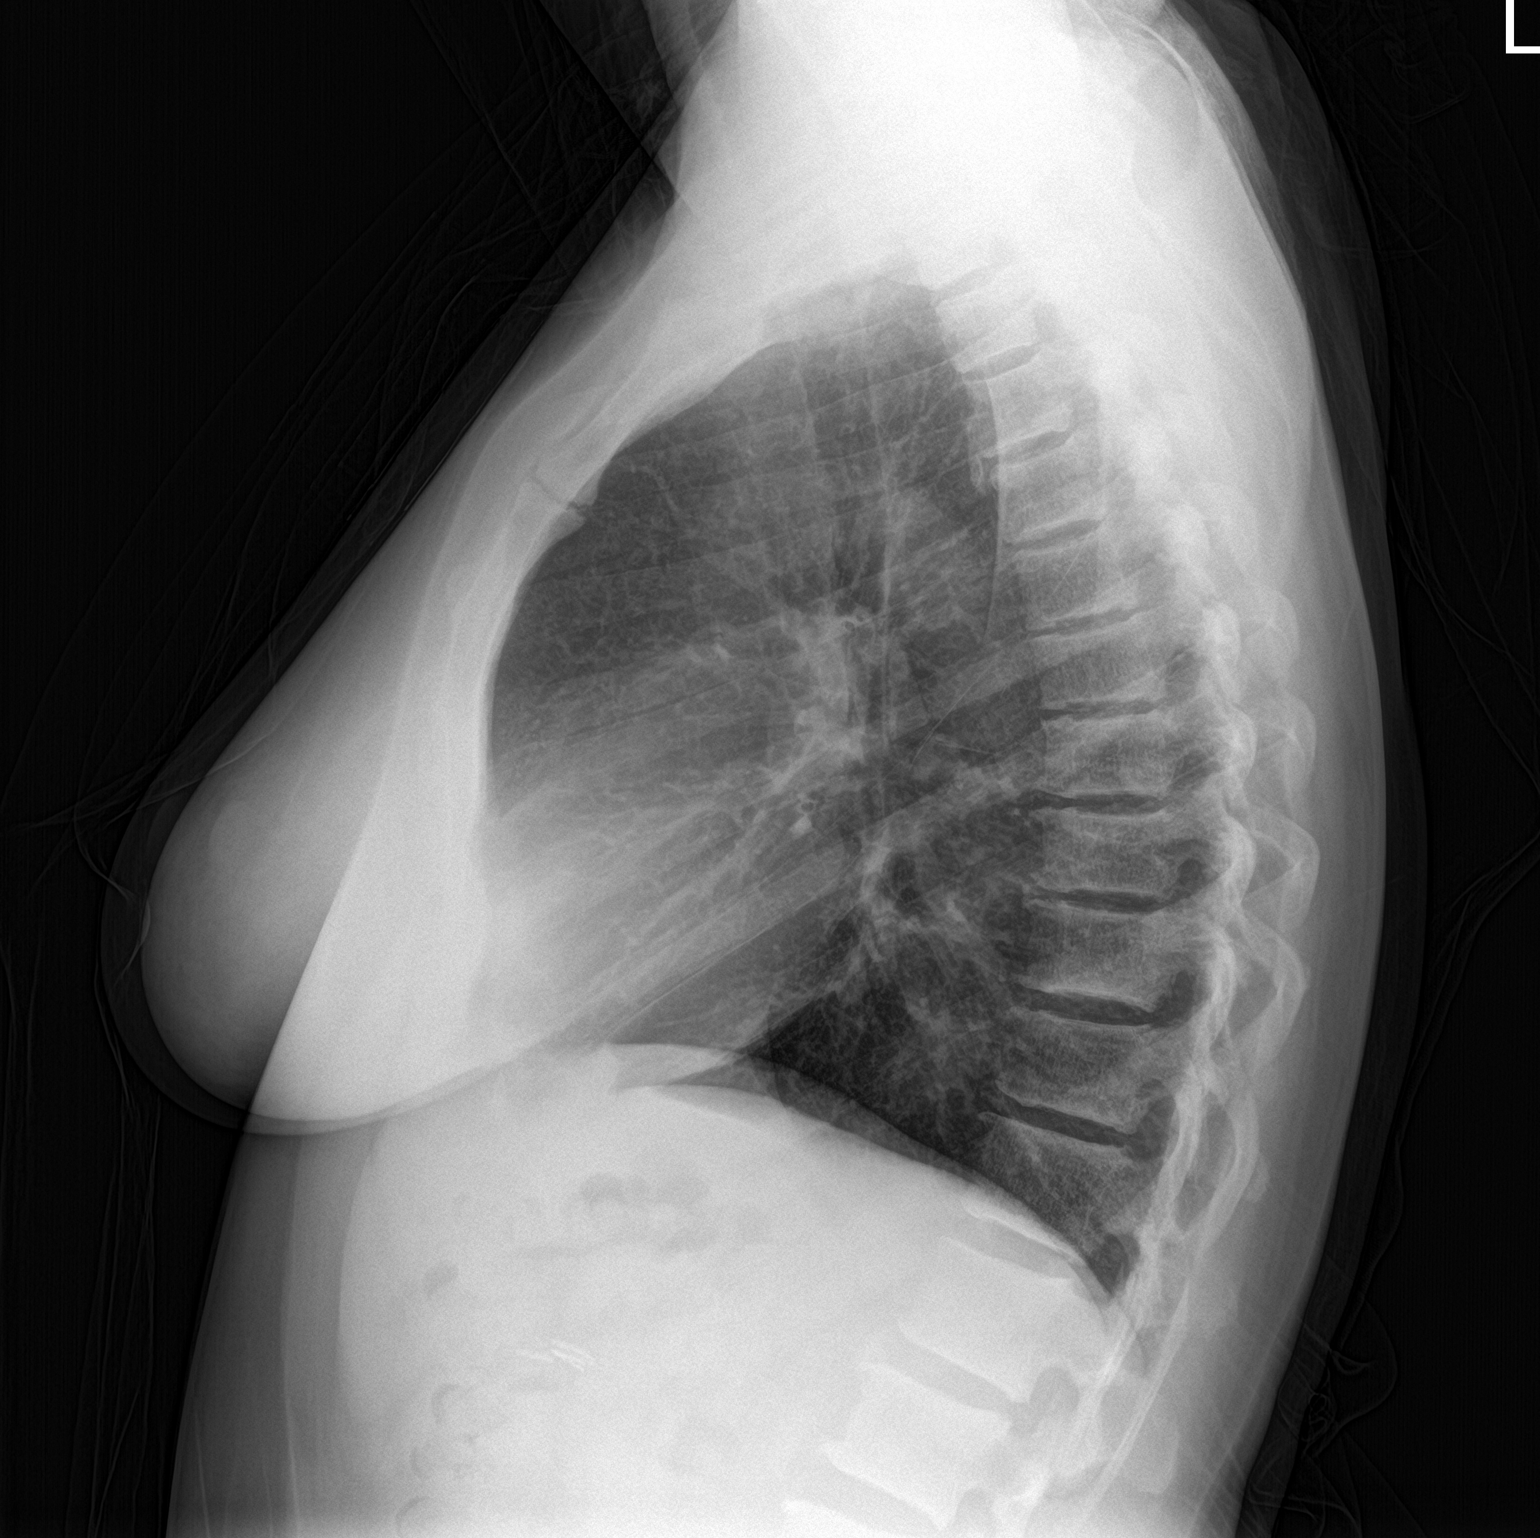

[2 of 2 positions shown; findings below may reference images not displayed]

FINDINGS: Mediastinum and hilar structures are normal. Heart size normal. No
focal alveolar infiltrate. No pleural effusion or pneumothorax. No
acute bony abnormality .
IMPRESSION: Stable chest.  No acute cardiopulmonary disease.

## 2017-12-08 ENCOUNTER — Other Ambulatory Visit: Payer: Self-pay

## 2017-12-08 ENCOUNTER — Emergency Department (INDEPENDENT_AMBULATORY_CARE_PROVIDER_SITE_OTHER)
Admission: EM | Admit: 2017-12-08 | Discharge: 2017-12-08 | Disposition: A | Discharge status: Home / Self Care | Payer: No Typology Code available for payment source | Source: Home / Self Care | Attending: Emergency Medicine | Admitting: Emergency Medicine

## 2017-12-08 ENCOUNTER — Encounter: Payer: Self-pay | Admitting: *Deleted

## 2017-12-08 DIAGNOSIS — T23102A Burn of first degree of left hand, unspecified site, initial encounter: Secondary | ICD-10-CM | POA: Diagnosis not present

## 2017-12-08 DIAGNOSIS — Z23 Encounter for immunization: Secondary | ICD-10-CM | POA: Diagnosis not present

## 2017-12-08 MED ORDER — MUPIROCIN CALCIUM 2 % EX CREA: 1.0000 "application " | TOPICAL_CREAM | Freq: Two times a day (BID) | CUTANEOUS | 0 refills | Status: AC

## 2017-12-08 MED ORDER — TETANUS-DIPHTH-ACELL PERTUSSIS 5-2.5-18.5 LF-MCG/0.5 IM SUSP
0.5000 mL | Freq: Once | INTRAMUSCULAR | Status: AC
  Administered 2017-12-08: 0.5 mL via INTRAMUSCULAR

## 2017-12-08 MED ORDER — ACETAMINOPHEN 325 MG PO TABS
650.0000 mg | ORAL_TABLET | Freq: Once | ORAL | Status: AC
  Administered 2017-12-08: 650 mg via ORAL

## 2017-12-08 NOTE — ED Provider Notes (Addendum)
[facility]    CSN: [id number] Arrival date & time: 12/08/17  0905     History   Chief Complaint Chief Complaint  Patient presents with  . Burn    HPI Ashley Lynn is a 52 y.o. female.  Patient was in her usual state of health until about 1-1/2 hours ago when she was fixing grits and a ladle of hot grits poured onto the back of her left hand involving the index and middle fingers.  She immediately treated the burn area with ice.  She presents now for evaluation of her burn. HPI  Past Medical History:  Diagnosis Date  . Anxiety   . Depression   . Diabetes mellitus without complication (HCC)    Told borderline by MD  . Endometriosis   . GERD (gastroesophageal reflux disease)     Patient Active Problem List   Diagnosis Date Noted  . ALLERGIC RHINITIS 09/28/2010  . GERD 09/28/2010  . WRIST PAIN, RIGHT 09/28/2010  . 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 ABDOMINAL HYSTERECTOMY    . CHOLECYSTECTOMY    . EYE SURGERY    . LAPAROSCOPIC ABDOMINAL EXPLORATION     several prior to Hysterectomy    OB History   None      Home Medications    Prior to Admission medications   Medication Sig Start Date End Date Taking? Authorizing Provider  budesonide-formoterol (SYMBICORT) 80-4.5 MCG/ACT inhaler Inhale 2 puffs into the lungs 2 (two) times daily.   Yes [provider]  cetirizine (ZYRTEC) 10 MG tablet Take 10 mg by mouth daily.   Yes [provider]  citalopram (CELEXA) 10 MG tablet Take 10 mg by mouth daily.   Yes [provider]  estradiol (VIVELLE-DOT) 0.075 MG/24HR Place 1 patch onto the skin 2 (two) times a week.   Yes [provider]    Family History Family History  Problem Relation Age of Onset  . Cancer Mother        breast  . Heart Problems Other   . Heart attack Father     Social History Social History   Tobacco Use  . Smoking status: Current  Every Day Smoker    Packs/day: 1.00    Years: 35.00    Pack years: 35.00    Types: Cigarettes  . Smokeless tobacco: Never Used  Substance Use Topics  . Alcohol use: No  . Drug use: No     Allergies   Codeine; Eggs or egg-derived products; Hydrocodone-acetaminophen; Naproxen; Penicillins; and Sulfamethoxazole-trimethoprim   Review of Systems Review of Systems  Skin:       Patient here with burn-like area on the back of her left hand.     Physical Exam Triage Vital Signs ED Triage Vitals [12/08/17 0926]  Enc Vitals Group     BP 134/82     Pulse Rate 90     Resp      Temp 98.2 F (36.8 C)     Temp Source Oral     SpO2 100 %     Weight 160 lb (72.6 kg)     Height      Head Circumference      Peak Flow      Pain Score 4     Pain Loc      Pain Edu?      Excl. in GC?    No data found.  Updated Vital Signs BP 134/82 (  BP Location: Right Arm)   Pulse 90   Temp 98.2 F (36.8 C) (Oral)   Wt 160 lb (72.6 kg)   SpO2 100%   BMI 27.46 kg/m   Visual Acuity Right Eye Distance:   Left Eye Distance:   Bilateral Distance:    Right Eye Near:   Left Eye Near:    Bilateral Near:     Physical Exam  Skin:  There is an area of redness over the second and third MCP joints which extends to the PIP joints and webspace of the index and middle finger.  There is no blister formation.  There is full range of motion, good circulation to the fingertips, and normal sensation to the fingertips.     UC Treatments / Results  Labs (all labs ordered are listed, but only abnormal results are displayed) Labs Reviewed - No data to display  EKG None Radiology No results found.  Procedures Procedures (including critical care time)  Medications Ordered in UC Medications  Tdap (BOOSTRIX) injection 0.5 mL (0.5 mLs Intramuscular Given 12/08/17 0933)  acetaminophen (TYLENOL) tablet 650 mg (650 mg Oral Given 12/08/17 0933)     Initial Impression / Assessment and Plan / UC Course    I have reviewed the triage vital signs and the nursing notes.  Pertinent labs & imaging results that were available during my care of the patient were reviewed by me and considered in my medical decision making (see chart for details). Patient has burn to the extensor surface of the second and third fingers left hand.  At present there is no blister formation but I suspect it will start to blister.  Will treat with Bactroban ointment twice a day after soap and water cleaning.  Her tetanus status will be updated.      Final Clinical Impressions(s) / UC Diagnoses   Final diagnoses:  Superficial burn of left hand, unspecified site of hand, initial encounter    ED Discharge Orders    None     Clean area with soap and water twice a day. Apply antibiotic ointment twice a day. Return to clinic on Sunday if you develop blisters. Keep your hand elevated and apply ice packs. Take Tylenol for pain  Controlled Substance Prescriptions [place] Controlled Substance Registry consulted? Not Applicable   Collene Gobbleaub, Shakirah Kirkey A, MD 12/08/17 [phone]    Collene Gobbleaub, Margurete Guaman A, MD 12/08/17 [phone]    Collene Gobbleaub, Thamar Holik A, MD 12/08/17 [phone]

## 2017-12-08 NOTE — ED Triage Notes (Signed)
Patient reports while pouring a ladle of grits from a fresh pot, the ladle tipped over and spilled the grits onto her left hand. She believes her tetanus status is not up to date.

## 2017-12-08 NOTE — Discharge Instructions (Addendum)
Clean area with soap and water twice a day. Apply antibiotic ointment twice a day. Return to clinic on Sunday if you develop blisters. Keep your hand elevated and apply ice packs. Take Tylenol for pain

## 2017-12-12 ENCOUNTER — Telehealth: Payer: Self-pay

## 2017-12-12 NOTE — Telephone Encounter (Signed)
Spoke with patient, hand is doing well.  Will follow up as needed

## 2018-06-12 ENCOUNTER — Emergency Department (INDEPENDENT_AMBULATORY_CARE_PROVIDER_SITE_OTHER): Payer: No Typology Code available for payment source

## 2018-06-12 ENCOUNTER — Emergency Department (INDEPENDENT_AMBULATORY_CARE_PROVIDER_SITE_OTHER)
Admission: EM | Admit: 2018-06-12 | Discharge: 2018-06-12 | Disposition: A | Discharge status: Home / Self Care | Payer: No Typology Code available for payment source | Source: Home / Self Care | Attending: Family Medicine | Admitting: Family Medicine

## 2018-06-12 ENCOUNTER — Other Ambulatory Visit: Payer: Self-pay

## 2018-06-12 DIAGNOSIS — M25571 Pain in right ankle and joints of right foot: Secondary | ICD-10-CM | POA: Diagnosis not present

## 2018-06-12 DIAGNOSIS — S9001XA Contusion of right ankle, initial encounter: Secondary | ICD-10-CM

## 2018-06-12 NOTE — ED Provider Notes (Signed)
[facility]    CSN: [id number] Arrival date & time: 06/12/18  1009     History   Chief Complaint Chief Complaint  Patient presents with  . Ankle Pain    HPI Ashley Lynn is a 52 y.o. female.   HPI Ashley Lynn is a 52 y.o. female presenting to UC with c/o Right medial ankle pain for about 3 day. No known injury but she has noticed some swelling and bruising to the inside of her ankle and notes her ankle yesterday "gave out" 4 or 5 times.  No prior hx of Right ankle problems. No hx of gout. Pain is worse with walking or bearing weigh at times.    Past Medical History:  Diagnosis Date  . Anxiety   . Depression   . Diabetes mellitus without complication (HCC)    Told borderline by MD  . Endometriosis   . GERD (gastroesophageal reflux disease)     Patient Active Problem List   Diagnosis Date Noted  . ALLERGIC RHINITIS 09/28/2010  . GERD 09/28/2010  . WRIST PAIN, RIGHT 09/28/2010  . 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 ABDOMINAL HYSTERECTOMY    . CHOLECYSTECTOMY    . EYE SURGERY    . LAPAROSCOPIC ABDOMINAL EXPLORATION     several prior to Hysterectomy    OB History   None      Home Medications    Prior to Admission medications   Medication Sig Start Date End Date Taking? Authorizing Provider  albuterol (PROVENTIL) (2.5 MG/3ML) 0.083% nebulizer solution Take 2.5 mg by nebulization every 6 (six) hours as needed for wheezing or shortness of breath.   Yes [provider]  budesonide-formoterol (SYMBICORT) 80-4.5 MCG/ACT inhaler Inhale 2 puffs into the lungs 2 (two) times daily.    [provider]  cetirizine (ZYRTEC) 10 MG tablet Take 10 mg by mouth daily.    [provider]  citalopram (CELEXA) 10 MG tablet Take 10 mg by mouth daily.    [provider]  estradiol (VIVELLE-DOT) 0.075 MG/24HR Place 1 patch onto the skin 2 (two) times a week.    [provider]  mupirocin cream (BACTROBAN) 2 % Apply 1 application topically 2 (two) times daily. 12/08/17   Collene Gobble, MD    Family History Family History  Problem Relation Age of Onset  . Cancer Mother        breast  . Heart Problems Other   . Heart attack Father     Social History Social History   Tobacco Use  . Smoking status: Current Every Day Smoker    Packs/day: 1.00    Years: 35.00    Pack years: 35.00    Types: Cigarettes  . Smokeless tobacco: Never Used  Substance Use Topics  . Alcohol use: No  . Drug use: No     Allergies   Codeine; Eggs or egg-derived products; Hydrocodone-acetaminophen; Naproxen; Penicillins; and Sulfamethoxazole-trimethoprim   Review of Systems Review of Systems  Musculoskeletal: Positive for arthralgias, joint swelling and myalgias.  Skin: Positive for color change. Negative for wound.  Neurological: Negative for weakness and numbness.     Physical Exam Triage Vital Signs ED Triage Vitals  Enc Vitals Group     BP 06/12/18 1031 134/79     Pulse Rate 06/12/18 1031 75     Resp --      Temp 06/12/18 1031 98.5 F (36.9 C)  Temp Source 06/12/18 1031 Oral     SpO2 06/12/18 1031 98 %     Weight 06/12/18 1032 159 lb (72.1 kg)     Height 06/12/18 1032 5\' 4"  (1.626 m)     Head Circumference --      Peak Flow --      Pain Score 06/12/18 1032 5     Pain Loc --      Pain Edu? --      Excl. in GC? --    No data found.  Updated Vital Signs BP 134/79 (BP Location: Right Arm)   Pulse 75   Temp 98.5 F (36.9 C) (Oral)   Ht 5\' 4"  (1.626 m)   Wt 159 lb (72.1 kg)   SpO2 98%   BMI 27.29 kg/m   Visual Acuity Right Eye Distance:   Left Eye Distance:   Bilateral Distance:    Right Eye Near:   Left Eye Near:    Bilateral Near:     Physical Exam  Constitutional: She is oriented to person, place, and time. She appears well-developed and well-nourished.  HENT:  Head: Normocephalic and atraumatic.  Eyes: EOM are normal.    Neck: Normal range of motion.  Cardiovascular: Normal rate.  Pulmonary/Chest: Effort normal.  Musculoskeletal: Normal range of motion. She exhibits edema and tenderness.  Right ankle, medial aspect: mild edema. Tenderness. Full ROM. Calf is soft, non-tender.  No tenderness to foot.   Neurological: She is alert and oriented to person, place, and time.  Skin: Skin is warm and dry. Capillary refill takes less than 2 seconds.  Right ankle, medial aspect: faint ecchymosis and erythema over medial malleolus. Tender. No induration or fluctuance. No red streaking.   Psychiatric: She has a normal mood and affect. Her behavior is normal.  Nursing note and vitals reviewed.    UC Treatments / Results  Labs (all labs ordered are listed, but only abnormal results are displayed) Labs Reviewed - No data to display  EKG None  Radiology Dg Ankle Complete Right  Result Date: 06/12/2018 CLINICAL DATA:  Right ankle pain. EXAM: RIGHT ANKLE - COMPLETE 3+ VIEW COMPARISON:  No recent. FINDINGS: No acute bony or joint abnormality identified. No evidence of fracture. IMPRESSION: No acute abnormality. Electronically Signed   By: Maisie Fus  Register   On: 06/12/2018 10:55    Procedures Procedures (including critical care time)  Medications Ordered in UC Medications - No data to display  Initial Impression / Assessment and Plan / UC Course  I have reviewed the triage vital signs and the nursing notes.  Pertinent labs & imaging results that were available during my care of the patient were reviewed by me and considered in my medical decision making (see chart for details).    Will tx as ankle sprain. ASO and ace wrap applied for comfort Encouraged R.I.C.E  Final Clinical Impressions(s) / UC Diagnoses   Final diagnoses:  Acute right ankle pain  Contusion of right ankle, initial encounter     Discharge Instructions      Please follow up with family medicine in 1-2 weeks if not improving.      ED Prescriptions    None     Controlled Substance Prescriptions [place] Controlled Substance Registry consulted? Not Applicable   Lurene Shadow, PA-C 06/12/18 1147

## 2018-06-12 NOTE — Discharge Instructions (Signed)
°  Please follow up with family medicine in 1-2 weeks if not improving.

## 2018-06-12 NOTE — ED Triage Notes (Signed)
Pt has been having ankle pain x 3 days.  Yesterday while at work, it gave out on her 4-5 times.  This morning it is swollen and red, mostly on the inside of her ankle.

## 2018-10-29 ENCOUNTER — Other Ambulatory Visit: Payer: Self-pay

## 2018-10-29 ENCOUNTER — Encounter: Payer: Self-pay | Admitting: Family Medicine

## 2018-10-29 ENCOUNTER — Emergency Department (INDEPENDENT_AMBULATORY_CARE_PROVIDER_SITE_OTHER)
Admission: EM | Admit: 2018-10-29 | Discharge: 2018-10-29 | Disposition: A | Discharge status: Home / Self Care | Payer: No Typology Code available for payment source | Source: Home / Self Care

## 2018-10-29 DIAGNOSIS — R69 Illness, unspecified: Secondary | ICD-10-CM

## 2018-10-29 DIAGNOSIS — J111 Influenza due to unidentified influenza virus with other respiratory manifestations: Secondary | ICD-10-CM

## 2018-10-29 MED ORDER — BENZONATATE 100 MG PO CAPS: 100.0000 mg | ORAL_CAPSULE | Freq: Three times a day (TID) | ORAL | 0 refills | Status: AC | PRN

## 2018-10-29 MED ORDER — OSELTAMIVIR PHOSPHATE 75 MG PO CAPS: 75.0000 mg | ORAL_CAPSULE | Freq: Two times a day (BID) | ORAL | 1 refills | Status: AC

## 2018-10-29 NOTE — ED Triage Notes (Signed)
Was with brother over the weekend, he was dx with the flu today.  This am woke up with generalized aches, fever, and deep cough.

## 2018-10-29 NOTE — ED Provider Notes (Addendum)
[facility]    CSN: [id number] Arrival date & time: 10/29/18  1807     History   Chief Complaint Chief Complaint  Patient presents with  . Cough  . Generalized Body Aches  . Fever    HPI Ashley Lynn is a 53 y.o. female.   This is a 53 year old established [facility] patient who presents with flulike symptoms.  She has a history of asthma and smoking.  She also has a h/o DM  Exposed to special needs brother over the weekend who tested positive for flu.  Patient's symptoms of chills, sweats, and dry cough began this morning.  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     Past Medical History:  Diagnosis Date  . Anxiety   . Depression   . Diabetes mellitus without complication (HCC)    Told borderline by MD  . Endometriosis   . GERD (gastroesophageal reflux disease)     Patient Active Problem List   Diagnosis Date Noted  . ALLERGIC RHINITIS 09/28/2010  . GERD 09/28/2010  . WRIST PAIN, RIGHT 09/28/2010  . 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 ABDOMINAL HYSTERECTOMY    . CHOLECYSTECTOMY    . EYE SURGERY    . LAPAROSCOPIC ABDOMINAL EXPLORATION     several prior to Hysterectomy    OB History   No obstetric history on file.      Home Medications    Prior to Admission medications   Medication Sig Start Date End Date Taking? Authorizing Provider  albuterol (PROVENTIL) (2.5 MG/3ML) 0.083% nebulizer solution Take 2.5 mg by nebulization every 6 (six) hours as needed for wheezing or shortness of breath.    [provider]  benzonatate (TESSALON) 100 MG capsule Take 1-2 capsules (100-200 mg total) by mouth 3 (three) times daily as needed for cough. 10/29/18   Elvina Sidle, MD  budesonide-formoterol (SYMBICORT) 80-4.5 MCG/ACT inhaler Inhale 2 puffs into the lungs 2 (two) times daily.    [provider]  cetirizine (ZYRTEC) 10 MG tablet Take 10 mg by  mouth daily.    [provider]  citalopram (CELEXA) 10 MG tablet Take 10 mg by mouth daily.    [provider]  estradiol (VIVELLE-DOT) 0.075 MG/24HR Place 1 patch onto the skin 2 (two) times a week.    [provider]  mupirocin cream (BACTROBAN) 2 % Apply 1 application topically 2 (two) times daily. 12/08/17   Collene Gobble, MD  oseltamivir (TAMIFLU) 75 MG capsule Take 1 capsule (75 mg total) by mouth every 12 (twelve) hours. 10/29/18   Elvina Sidle, MD    Family History Family History  Problem Relation Age of Onset  . Cancer Mother        breast  . Heart Problems Other   . Heart attack Father     Social History Social History   Tobacco Use  . Smoking status: Current Every Day Smoker    Packs/day: 1.00    Years: 35.00    Pack years: 35.00    Types: Cigarettes  . Smokeless tobacco: Never Used  Substance Use Topics  . Alcohol use: No  . Drug use: No     Allergies   Codeine; Eggs or egg-derived products; Hydrocodone-acetaminophen; Naproxen; Penicillins; and Sulfamethoxazole-trimethoprim   Review of Systems Review of Systems   Physical Exam Triage Vital Signs ED Triage Vitals  Enc Vitals Group     BP  Pulse      Resp      Temp      Temp src      SpO2      Weight      Height      Head Circumference      Peak Flow      Pain Score      Pain Loc      Pain Edu?      Excl. in GC?    No data found.  Updated Vital Signs BP 127/79 (BP Location: Right Arm)   Pulse 79   Temp 99.8 F (37.7 C) (Oral)   Resp 20   Ht 5\' 4"  (1.626 m)   Wt 74.4 kg   SpO2 96%   BMI 28.15 kg/m    Physical Exam Vitals signs and nursing note reviewed.  Constitutional:      General: She is not in acute distress.    Appearance: Normal appearance. She is not ill-appearing or toxic-appearing.  HENT:     Head: Normocephalic.     Right Ear: Tympanic membrane normal.     Left Ear: Tympanic membrane normal.     Nose: Congestion present.      Mouth/Throat:     Mouth: Mucous membranes are moist.  Eyes:     Conjunctiva/sclera: Conjunctivae normal.  Neck:     Musculoskeletal: Normal range of motion and neck supple.  Cardiovascular:     Rate and Rhythm: Normal rate and regular rhythm.     Pulses: Normal pulses.     Heart sounds: Normal heart sounds.  Pulmonary:     Effort: Pulmonary effort is normal.     Breath sounds: Normal breath sounds.  Musculoskeletal: Normal range of motion.  Skin:    General: Skin is warm and dry.  Neurological:     General: No focal deficit present.     Mental Status: She is alert and oriented to person, place, and time.  Psychiatric:        Mood and Affect: Mood normal.      UC Treatments / Results  Labs (all labs ordered are listed, but only abnormal results are displayed) Labs Reviewed - No data to display  EKG None  Radiology No results found.  Procedures Procedures (including critical care time)  Medications Ordered in UC Medications - No data to display  Initial Impression / Assessment and Plan / UC Course  I have reviewed the triage vital signs and the nursing notes.  Pertinent labs & imaging results that were available during my care of the patient were reviewed by me and considered in my medical decision making (see chart for details).    Final Clinical Impressions(s) / UC Diagnoses   Final diagnoses:  Influenza-like illness   Discharge Instructions   None    ED Prescriptions    Medication Sig Dispense Auth. Provider   benzonatate (TESSALON) 100 MG capsule Take 1-2 capsules (100-200 mg total) by mouth 3 (three) times daily as needed for cough. 40 capsule Elvina Sidle, MD   oseltamivir (TAMIFLU) 75 MG capsule Take 1 capsule (75 mg total) by mouth every 12 (twelve) hours. 10 capsule Elvina Sidle, MD     Controlled Substance Prescriptions [place] Controlled Substance Registry consulted? Not Applicable   Elvina Sidle, MD 10/29/18 Maureen Chatters    Elvina Sidle, MD 10/29/18 [phone]

## 2018-11-05 ENCOUNTER — Telehealth: Payer: Self-pay

## 2018-11-05 NOTE — Telephone Encounter (Signed)
Received a call from pharmacy for authorization on refill of Tamiflu. Spoke with Dr Cathren Harsh, and tamiflu does not need to be refilled.  Per Dr Cathren Harsh, it will not help to take medication longer.   Left message on pt's VM to call to find out why she is requesting a refill.

## 2019-01-29 IMAGING — DX DG ANKLE COMPLETE 3+V*R*
3 series · 3 of 3 positions shown · non-contrast
Comparison: No recent.

CLINICAL DATA: Right ankle pain.

EXAM:
RIGHT ANKLE - COMPLETE 3+ VIEW

[ankle ap]
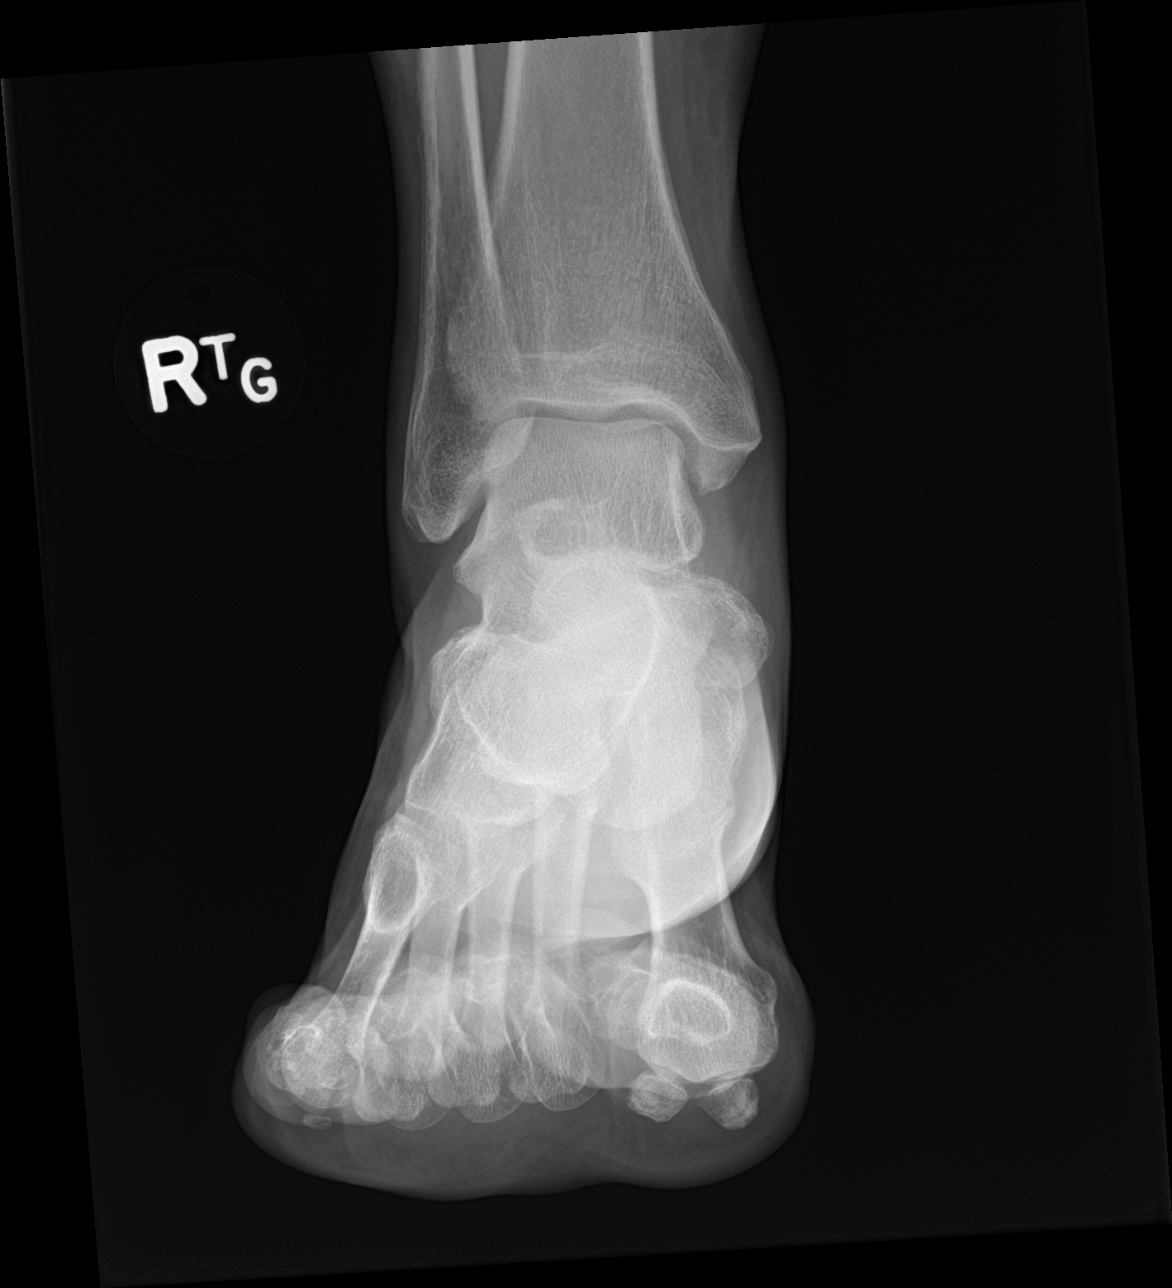

[ankle obl]
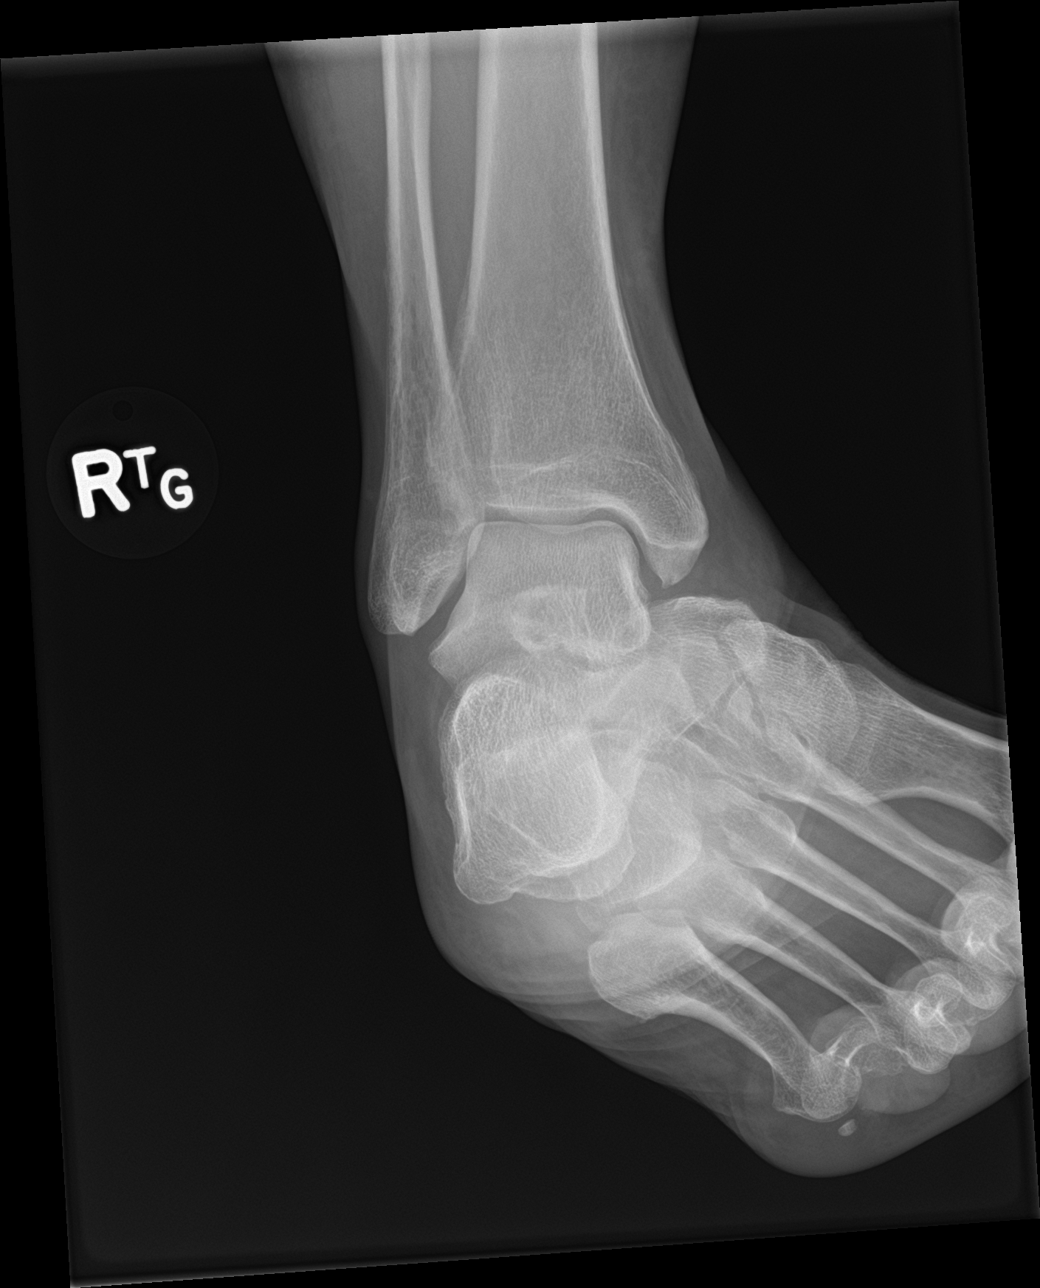

[ankle lat]
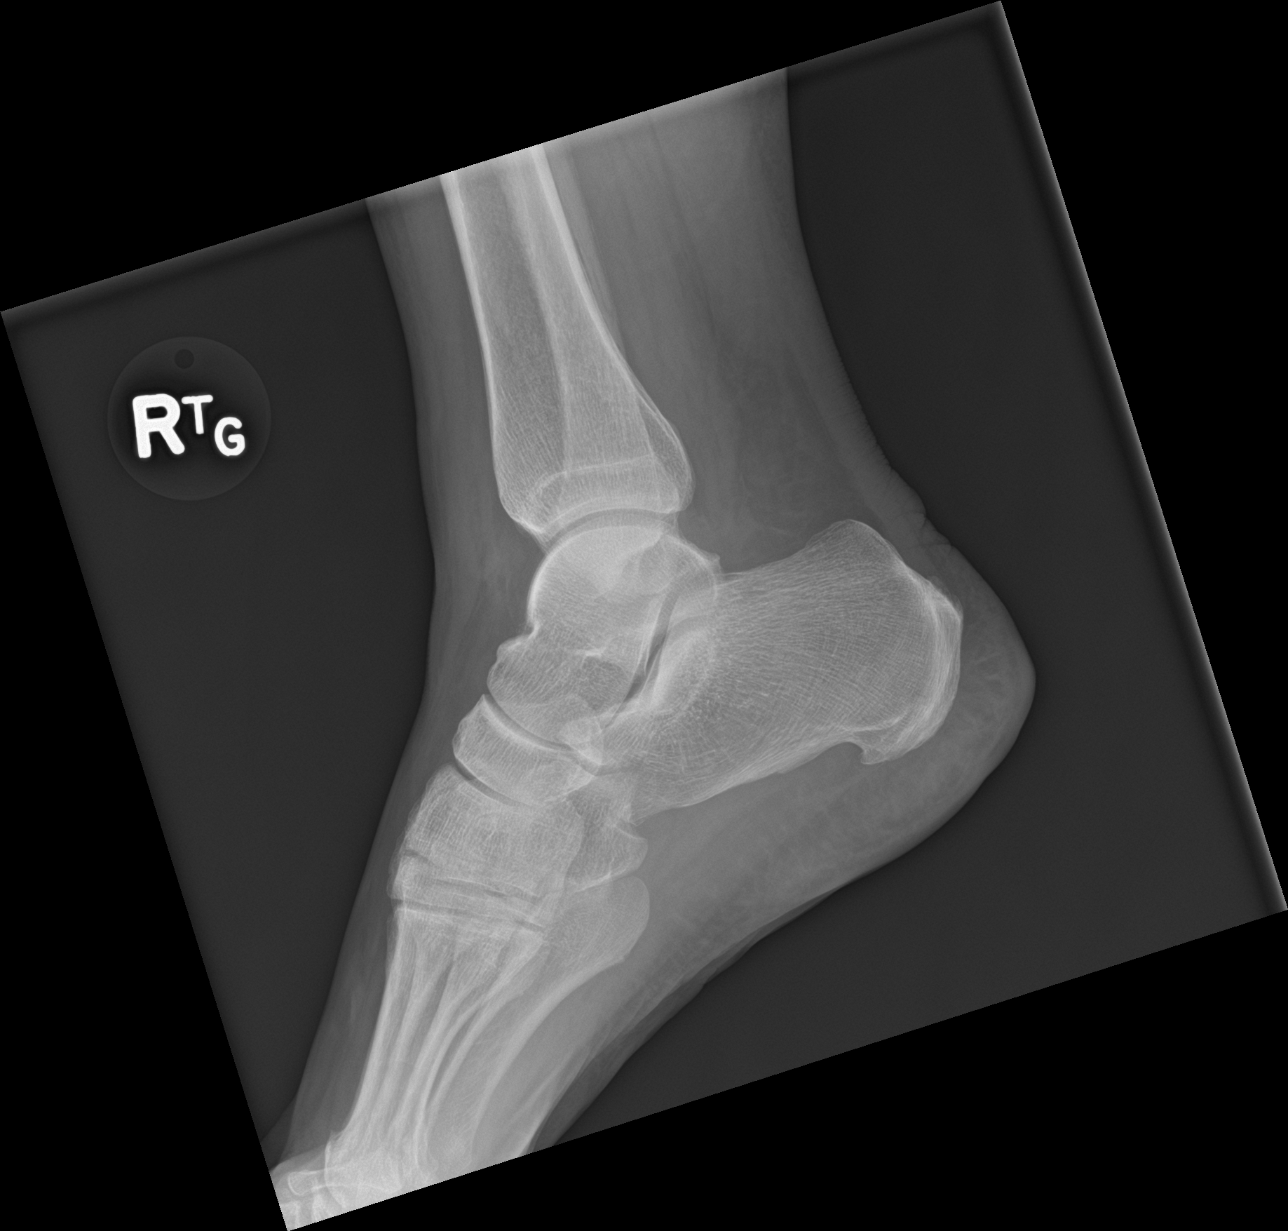

[3 of 3 positions shown; findings below may reference images not displayed]

FINDINGS: No acute bony or joint abnormality identified. No evidence of
fracture.
IMPRESSION: No acute abnormality.
